# Patient Record
Sex: Female | Born: 1955 | Race: White | Hispanic: No | Marital: Married | Smoking: Current every day smoker
Health system: Southern US, Community
[De-identification: ages and names within clinical notes are randomized; demographics above are authoritative.]

## PROBLEM LIST (undated history)

## (undated) DIAGNOSIS — F32A Depression, unspecified: Secondary | ICD-10-CM

## (undated) DIAGNOSIS — E119 Type 2 diabetes mellitus without complications: Secondary | ICD-10-CM

## (undated) DIAGNOSIS — Z9889 Other specified postprocedural states: Secondary | ICD-10-CM

## (undated) DIAGNOSIS — R000 Tachycardia, unspecified: Secondary | ICD-10-CM

## (undated) DIAGNOSIS — R079 Chest pain, unspecified: Secondary | ICD-10-CM

## (undated) DIAGNOSIS — M1610 Unilateral primary osteoarthritis, unspecified hip: Secondary | ICD-10-CM

## (undated) DIAGNOSIS — T8859XA Other complications of anesthesia, initial encounter: Secondary | ICD-10-CM

## (undated) DIAGNOSIS — R112 Nausea with vomiting, unspecified: Secondary | ICD-10-CM

## (undated) DIAGNOSIS — Q159 Congenital malformation of eye, unspecified: Secondary | ICD-10-CM

## (undated) DIAGNOSIS — Z720 Tobacco use: Secondary | ICD-10-CM

## (undated) DIAGNOSIS — F329 Major depressive disorder, single episode, unspecified: Secondary | ICD-10-CM

## (undated) DIAGNOSIS — I10 Essential (primary) hypertension: Secondary | ICD-10-CM

## (undated) DIAGNOSIS — T4145XA Adverse effect of unspecified anesthetic, initial encounter: Secondary | ICD-10-CM

## (undated) DIAGNOSIS — E785 Hyperlipidemia, unspecified: Secondary | ICD-10-CM

## (undated) DIAGNOSIS — G43909 Migraine, unspecified, not intractable, without status migrainosus: Secondary | ICD-10-CM

## (undated) HISTORY — DX: Unilateral primary osteoarthritis, unspecified hip: M16.10

## (undated) HISTORY — DX: Tobacco use: Z72.0

## (undated) HISTORY — DX: Essential (primary) hypertension: I10

## (undated) HISTORY — DX: Type 2 diabetes mellitus without complications: E11.9

## (undated) HISTORY — DX: Chest pain, unspecified: R07.9

## (undated) HISTORY — DX: Hyperlipidemia, unspecified: E78.5

## (undated) HISTORY — DX: Congenital malformation of eye, unspecified: Q15.9

## (undated) HISTORY — PX: TONSILLECTOMY: SUR1361

## (undated) HISTORY — PX: TUBAL LIGATION: SHX77

---

## 2000-09-25 HISTORY — PX: CARDIAC CATHETERIZATION: SHX172

## 2011-11-01 ENCOUNTER — Encounter: Payer: Self-pay | Admitting: Cardiovascular Disease

## 2011-11-08 ENCOUNTER — Encounter: Payer: Self-pay | Admitting: Cardiovascular Disease

## 2011-11-08 ENCOUNTER — Ambulatory Visit (INDEPENDENT_AMBULATORY_CARE_PROVIDER_SITE_OTHER): Payer: PRIVATE HEALTH INSURANCE | Admitting: Cardiovascular Disease

## 2011-11-08 VITALS — BP 150/80 | HR 113 | Ht 61.0 in | Wt 152.5 lb

## 2011-11-08 DIAGNOSIS — IMO0001 Reserved for inherently not codable concepts without codable children: Secondary | ICD-10-CM

## 2011-11-08 DIAGNOSIS — F17200 Nicotine dependence, unspecified, uncomplicated: Secondary | ICD-10-CM

## 2011-11-08 DIAGNOSIS — E785 Hyperlipidemia, unspecified: Secondary | ICD-10-CM | POA: Insufficient documentation

## 2011-11-08 DIAGNOSIS — E119 Type 2 diabetes mellitus without complications: Secondary | ICD-10-CM

## 2011-11-08 DIAGNOSIS — I10 Essential (primary) hypertension: Secondary | ICD-10-CM

## 2011-11-08 DIAGNOSIS — Z7289 Other problems related to lifestyle: Secondary | ICD-10-CM

## 2011-11-08 DIAGNOSIS — Z789 Other specified health status: Secondary | ICD-10-CM

## 2011-11-08 DIAGNOSIS — R079 Chest pain, unspecified: Secondary | ICD-10-CM

## 2011-11-08 MED ORDER — METOPROLOL TARTRATE 25 MG PO TABS: 25.0000 mg | ORAL_TABLET | Freq: Two times a day (BID) | ORAL | Status: DC

## 2011-11-08 MED ORDER — ATORVASTATIN CALCIUM 20 MG PO TABS: 20.0000 mg | ORAL_TABLET | Freq: Every day | ORAL | Status: DC

## 2011-11-08 NOTE — Assessment & Plan Note (Signed)
She has several risk factors for coronary artery disease including diabetes, hyperlipidemia, smoking. Strong family history of coronary artery disease. We will order a pharmacologic stress test as she is unable to treadmill secondary to back pain.

## 2011-11-08 NOTE — Assessment & Plan Note (Signed)
We'll start Lipitor 10 mg for several weeks titrating up to 20 mg daily. This suggested she stay on her gemfibrozil. Goal LDL less than 100 given she is diabetic.

## 2011-11-08 NOTE — Progress Notes (Signed)
Patient ID: Andrea [name], female    DOB: [birth year], 56 y.o.   MRN: [id number]  HPI Comments: Andrea [name] is a very pleasant 56 year old woman with a history of diabetes, hypertension, hyperlipidemia, long history of smoking for 40 years, strong family history of coronary artery disease with father who had coronary disease and stenting x3 in his early 73s, who presents with recent symptoms of chest pain with exertion.   She reports having symptoms twice per week over the past several months. Typically this occurs with exertion. Symptoms resolved when she stops her activity. She has had shortness of breath with stairs. She described her chest symptoms as a tightness lasting less than 1 minute typically. She denies any diaphoresis, lightheadedness or dizziness.  She does report having a cardiac catheterization in 2000 and was told she had mild coronary artery disease with no severe stenosis. Treadmill stress test around that time with difficulty tolerating the treadmill.  She now has worsening back pain and unable to walk fast or long distances.  Recent hemoglobin A1c 6.7, total cholesterol 279, HDL 170, triglycerides 290  EKG shows sinus tachycardia with rate 113 beats per minute with nonspecific ST abnormality   Outpatient Encounter Prescriptions as of 11/08/2011  Medication Sig Dispense Refill  . aspirin 81 MG tablet Take 81 mg by mouth daily.      Marland Kitchen gabapentin (NEURONTIN) 100 MG capsule Take 100 mg by mouth 2 (two) times daily.      Marland Kitchen gemfibrozil (LOPID) 600 MG tablet Take 600 mg by mouth 2 (two) times daily before a meal.      . lisinopril-hydrochlorothiazide (PRINZIDE,ZESTORETIC) 10-12.5 MG per tablet Take 1 tablet by mouth daily.      . metFORMIN (GLUCOPHAGE-XR) 500 MG 24 hr tablet Take 500 mg by mouth 2 (two) times daily.      . MULTIPLE VITAMIN tablet Take 1 tablet by mouth daily.      . Nutritional Supplements (ESTROVEN ENERGY PO) Take by mouth daily.      . Omega-3 Fatty Acids (FISH  OIL BURP-LESS) 1000 MG CAPS Take 1,000 mg by mouth daily.      . potassium gluconate 595 MG TABS Take 595 mg by mouth daily.       Review of Systems  Constitutional: Negative.   HENT: Negative.   Eyes: Negative.   Respiratory: Positive for shortness of breath.   Cardiovascular: Positive for chest pain and palpitations.  Gastrointestinal: Negative.   Musculoskeletal: Negative.   Skin: Negative.   Neurological: Negative.   Hematological: Negative.   Psychiatric/Behavioral: Negative.   All other systems reviewed and are negative.     BP 150/80  Pulse 113  Ht 5\' 1"  (1.549 m)  Wt 152 lb 8 oz (69.174 kg)  BMI 28.81 kg/m2  Physical Exam  Nursing note and vitals reviewed. Constitutional: She is oriented to person, place, and time. She appears well-developed and well-nourished.  HENT:  Head: Normocephalic.  Nose: Nose normal.  Mouth/Throat: Oropharynx is clear and moist.  Eyes: Conjunctivae are normal. Pupils are equal, round, and reactive to light.  Neck: Normal range of motion. Neck supple. No JVD present.  Cardiovascular: Normal rate, regular rhythm, S1 normal, S2 normal, normal heart sounds and intact distal pulses.  Exam reveals no gallop and no friction rub.   No murmur heard. Pulmonary/Chest: Effort normal and breath sounds normal. No respiratory distress. She has no wheezes. She has no rales. She exhibits no tenderness.  Abdominal: Soft. Bowel sounds are normal. She  exhibits no distension. There is no tenderness.  Musculoskeletal: Normal range of motion. She exhibits no edema and no tenderness.  Lymphadenopathy:    She has no cervical adenopathy.  Neurological: She is alert and oriented to person, place, and time. Coordination normal.  Skin: Skin is warm and dry. No rash noted. No erythema.  Psychiatric: She has a normal mood and affect. Her behavior is normal. Judgment and thought content normal.         Assessment and Plan       He is

## 2011-11-08 NOTE — Assessment & Plan Note (Signed)
We have encouraged continued exercise, careful diet management in an effort to lose weight. 

## 2011-11-08 NOTE — Patient Instructions (Addendum)
You are doing well. Please start metoprolol one pill twice a day for heart rate and blood pressure  Please start low dose lipitor one in the evening  We will schedule a stress test No caffeine 24 hours before the test No metoprolol the night before or morning of the test No food the morning of the test   Please call us if you have new issues that need to be addressed before your next appt.  Your physician wants you to follow-up in: 6 months.  You will receive a reminder letter in the mail two months in advance. If you don't receive a letter, please call our office to schedule the follow-up appointment.

## 2011-11-08 NOTE — Assessment & Plan Note (Signed)
We have strongly encouraged her to quit smoking. She has been smoking for 40 years.

## 2011-11-08 NOTE — Assessment & Plan Note (Signed)
Blood pressure is elevated. Heart rate is also elevated today. We'll start metoprolol tartrate 25 mg twice a day.

## 2011-11-17 ENCOUNTER — Ambulatory Visit: Payer: Self-pay | Admitting: Cardiovascular Disease

## 2011-11-17 DIAGNOSIS — R079 Chest pain, unspecified: Secondary | ICD-10-CM

## 2011-11-20 ENCOUNTER — Other Ambulatory Visit: Payer: Self-pay | Admitting: Cardiovascular Disease

## 2011-11-20 DIAGNOSIS — R079 Chest pain, unspecified: Secondary | ICD-10-CM

## 2011-11-20 DIAGNOSIS — Z789 Other specified health status: Secondary | ICD-10-CM

## 2011-11-24 ENCOUNTER — Telehealth: Payer: Self-pay | Admitting: Cardiovascular Disease

## 2011-11-24 NOTE — Telephone Encounter (Signed)
Pt calling for results from stress test. Told pt that it had been sent to [facility] for review

## 2011-11-24 NOTE — Telephone Encounter (Signed)
Can you review her test so I can call with results, please? Thanks!

## 2011-11-24 NOTE — Telephone Encounter (Signed)
If she needs details, it is in computer Looks normal, no ischemia thx

## 2011-11-24 NOTE — Telephone Encounter (Signed)
Pt informed Understanding verb 

## 2012-02-07 ENCOUNTER — Other Ambulatory Visit: Payer: Self-pay | Admitting: Cardiovascular Disease

## 2012-02-07 MED ORDER — ATORVASTATIN CALCIUM 20 MG PO TABS: 20.0000 mg | ORAL_TABLET | Freq: Every day | ORAL | Status: DC

## 2012-02-07 MED ORDER — METOPROLOL TARTRATE 25 MG PO TABS: 25.0000 mg | ORAL_TABLET | Freq: Two times a day (BID) | ORAL | Status: DC

## 2012-02-07 NOTE — Telephone Encounter (Signed)
Sent 90 day Refill for Metoprolol and Atorvastatin.

## 2012-02-07 NOTE — Telephone Encounter (Signed)
90 DAY SUPPLY SENT IN WITH REFILLS

## 2012-02-09 ENCOUNTER — Other Ambulatory Visit: Payer: Self-pay

## 2012-02-09 MED ORDER — ATORVASTATIN CALCIUM 20 MG PO TABS: 20.0000 mg | ORAL_TABLET | Freq: Every day | ORAL | Status: DC

## 2012-02-09 NOTE — Telephone Encounter (Signed)
Refill sent for atorvastatin. 

## 2012-02-12 ENCOUNTER — Other Ambulatory Visit: Payer: Self-pay

## 2012-02-12 MED ORDER — ATORVASTATIN CALCIUM 20 MG PO TABS: 20.0000 mg | ORAL_TABLET | Freq: Every day | ORAL | Status: DC

## 2012-02-12 NOTE — Telephone Encounter (Signed)
Refill sent for atorvastatin. 

## 2012-03-04 ENCOUNTER — Other Ambulatory Visit: Payer: Self-pay | Admitting: Cardiovascular Disease

## 2012-03-04 MED ORDER — ATORVASTATIN CALCIUM 20 MG PO TABS: 20.0000 mg | ORAL_TABLET | Freq: Every day | ORAL | Status: DC

## 2012-03-04 NOTE — Telephone Encounter (Signed)
Refilled Atorvastatin.

## 2012-04-24 ENCOUNTER — Ambulatory Visit: Payer: Self-pay | Admitting: Family Medicine

## 2012-05-06 ENCOUNTER — Encounter: Payer: Self-pay | Admitting: Cardiovascular Disease

## 2012-05-06 ENCOUNTER — Ambulatory Visit (INDEPENDENT_AMBULATORY_CARE_PROVIDER_SITE_OTHER): Payer: PRIVATE HEALTH INSURANCE | Admitting: Cardiovascular Disease

## 2012-05-06 VITALS — BP 122/80 | HR 89 | Ht 60.0 in | Wt 159.5 lb

## 2012-05-06 DIAGNOSIS — E119 Type 2 diabetes mellitus without complications: Secondary | ICD-10-CM

## 2012-05-06 DIAGNOSIS — F17200 Nicotine dependence, unspecified, uncomplicated: Secondary | ICD-10-CM

## 2012-05-06 DIAGNOSIS — I10 Essential (primary) hypertension: Secondary | ICD-10-CM

## 2012-05-06 DIAGNOSIS — E785 Hyperlipidemia, unspecified: Secondary | ICD-10-CM

## 2012-05-06 MED ORDER — ATORVASTATIN CALCIUM 40 MG PO TABS: 40.0000 mg | ORAL_TABLET | Freq: Every day | ORAL | Status: DC

## 2012-05-06 NOTE — Assessment & Plan Note (Signed)
We have encouraged continued exercise, careful diet management in an effort to lose weight. 

## 2012-05-06 NOTE — Patient Instructions (Addendum)
You are doing well. No medication changes were made.  Please call us if you have new issues that need to be addressed before your next appt.  Your physician wants you to follow-up in: 6 months.  You will receive a reminder letter in the mail two months in advance. If you don't receive a letter, please call our office to schedule the follow-up appointment.   

## 2012-05-06 NOTE — Assessment & Plan Note (Signed)
We have encouraged her to restart Lipitor 20 mg daily. If no side effects within several weeks, increase Lipitor to 40 mg daily. LFTs are relatively benign with ALT 49 (likely from fatty liver). We have recommended weight loss.

## 2012-05-06 NOTE — Progress Notes (Signed)
Patient ID: Genola Swaziland, female    DOB: Dec 18, 1955, 56 y.o.   MRN: [id number]  HPI Comments: Ms. Swaziland is a very pleasant 56 year old woman with a history of diabetes, hypertension, hyperlipidemia, long history of smoking for 40 years, who continues to smoke,  strong family history of coronary artery disease with father who had coronary disease and stenting x3 in his early 68s, who presents for routine followup. Prior episodes of chest pain with negative stress test.  She denies any recent episodes of chest pain. She does report having stomach problems and stopped her statin, gemfibrozil and Neurontin. She continues to smoke 7-15 cigarettes per day. Hemoglobin A1c 7.3. She does report having back problems. She attributes her abdominal pain to gemfibrozil.  She does report having a cardiac catheterization in 2000 and was told she had mild coronary artery disease with no severe stenosis. Treadmill stress test around that time with difficulty tolerating the treadmill.   Lab work shows total cholesterol 262, LDL 159, triglycerides 284, hemoglobin A1c 7.3 Abdominal ultrasound showing hepatic steatohepatitis  EKG shows normal sinus rhythm with rate 89 beats per minute, no significant ST or T wave changes   Outpatient Encounter Prescriptions as of 05/06/2012  Medication Sig Dispense Refill  . aspirin 81 MG tablet Take 81 mg by mouth daily.      Marland Kitchen lisinopril-hydrochlorothiazide (PRINZIDE,ZESTORETIC) 10-12.5 MG per tablet Take 1 tablet by mouth daily.      . metFORMIN (GLUCOPHAGE-XR) 500 MG 24 hr tablet Take 500 mg by mouth 2 (two) times daily.      . metoprolol tartrate (LOPRESSOR) 25 MG tablet Take 1 tablet (25 mg total) by mouth 2 (two) times daily.  180 tablet  3  . MULTIPLE VITAMIN tablet Take 1 tablet by mouth daily.      . Nutritional Supplements (ESTROVEN ENERGY PO) Take by mouth daily.      . Omega-3 Fatty Acids (FISH OIL BURP-LESS) 1000 MG CAPS Take 1,000 mg by mouth daily.      .  potassium gluconate 595 MG TABS Take 595 mg by mouth daily.      Marland Kitchen atorvastatin (LIPITOR) 40 MG tablet Take 1 tablet (40 mg total) by mouth daily.  30 tablet  11    Review of Systems  Constitutional: Negative.   HENT: Negative.   Eyes: Negative.   Gastrointestinal: Positive for abdominal pain.  Musculoskeletal: Negative.   Skin: Negative.   Neurological: Negative.   Hematological: Negative.   Psychiatric/Behavioral: Negative.   All other systems reviewed and are negative.     BP 122/80  Pulse 89  Ht 5' (1.524 m)  Wt 159 lb 8 oz (72.349 kg)  BMI 31.15 kg/m2  Physical Exam  Nursing note and vitals reviewed. Constitutional: She is oriented to person, place, and time. She appears well-developed and well-nourished.  HENT:  Head: Normocephalic.  Nose: Nose normal.  Mouth/Throat: Oropharynx is clear and moist.  Eyes: Conjunctivae normal are normal. Pupils are equal, round, and reactive to light.  Neck: Normal range of motion. Neck supple. No JVD present.  Cardiovascular: Normal rate, regular rhythm, S1 normal, S2 normal, normal heart sounds and intact distal pulses.  Exam reveals no gallop and no friction rub.   No murmur heard. Pulmonary/Chest: Effort normal and breath sounds normal. No respiratory distress. She has no wheezes. She has no rales. She exhibits no tenderness.  Abdominal: Soft. Bowel sounds are normal. She exhibits no distension. There is no tenderness.  Musculoskeletal: Normal range of motion.  She exhibits no edema and no tenderness.  Lymphadenopathy:    She has no cervical adenopathy.  Neurological: She is alert and oriented to person, place, and time. Coordination normal.  Skin: Skin is warm and dry. No rash noted. No erythema.  Psychiatric: She has a normal mood and affect. Her behavior is normal. Judgment and thought content normal.         Assessment and Plan       He is

## 2012-05-06 NOTE — Assessment & Plan Note (Signed)
We have encouraged her to continue to work on weaning her cigarettes and smoking cessation. She will continue to work on this and does not want any assistance with chantix.  

## 2012-05-06 NOTE — Assessment & Plan Note (Signed)
Blood pressure is well controlled on today's visit. No changes made to the medications. 

## 2012-08-29 ENCOUNTER — Other Ambulatory Visit: Payer: Self-pay | Admitting: Cardiovascular Disease

## 2013-01-01 ENCOUNTER — Ambulatory Visit (INDEPENDENT_AMBULATORY_CARE_PROVIDER_SITE_OTHER): Payer: BC Managed Care – PPO | Admitting: Cardiovascular Disease

## 2013-01-01 ENCOUNTER — Encounter: Payer: Self-pay | Admitting: Cardiovascular Disease

## 2013-01-01 VITALS — BP 128/82 | HR 104 | Ht 61.0 in | Wt 161.5 lb

## 2013-01-01 DIAGNOSIS — I498 Other specified cardiac arrhythmias: Secondary | ICD-10-CM

## 2013-01-01 DIAGNOSIS — E119 Type 2 diabetes mellitus without complications: Secondary | ICD-10-CM

## 2013-01-01 DIAGNOSIS — E785 Hyperlipidemia, unspecified: Secondary | ICD-10-CM

## 2013-01-01 DIAGNOSIS — F17200 Nicotine dependence, unspecified, uncomplicated: Secondary | ICD-10-CM

## 2013-01-01 DIAGNOSIS — I10 Essential (primary) hypertension: Secondary | ICD-10-CM

## 2013-01-01 DIAGNOSIS — R000 Tachycardia, unspecified: Secondary | ICD-10-CM | POA: Insufficient documentation

## 2013-01-01 MED ORDER — METOPROLOL TARTRATE 50 MG PO TABS: 50.0000 mg | ORAL_TABLET | Freq: Two times a day (BID) | ORAL | Status: DC

## 2013-01-01 MED ORDER — SIMVASTATIN 40 MG PO TABS: 40.0000 mg | ORAL_TABLET | Freq: Every day | ORAL | Status: DC

## 2013-01-01 NOTE — Assessment & Plan Note (Signed)
Uncertain why she stopped her generic Lipitor. We have suggested she try simvastatin 20 mg daily for several weeks before titrating up to 40 mg daily.

## 2013-01-01 NOTE — Assessment & Plan Note (Signed)
We have encouraged her to continue to work on weaning her cigarettes and smoking cessation. She will continue to work on this and does not want any assistance with chantix.  

## 2013-01-01 NOTE — Patient Instructions (Addendum)
Your heart rate is elevated Please increase the  Metoprolol to 50 mg twice a day  Please cut the lisinopril HCTZ in 1/2  Please start simvastatin 1/2 pill once a day After a few weeks, increase the dose to a full pill  Please call us if you have new issues that need to be addressed before your next appt.  Your physician wants you to follow-up in: 6 months.  You will receive a reminder letter in the mail two months in advance. If you don't receive a letter, please call our office to schedule the follow-up appointment.

## 2013-01-01 NOTE — Assessment & Plan Note (Signed)
Heart rate continued to be greater than 100 throughout the entire visit today. We will increase her metoprolol up to 50 mm twice a day and decrease her lisinopril HCTZ in half in an effort to slow her heart rate. If heart rate continues to run fast, could increase metoprolol to 100 mg twice a day and hold the lisinopril HCT.

## 2013-01-01 NOTE — Assessment & Plan Note (Signed)
We have encouraged continued exercise, careful diet management in an effort to lose weight. 

## 2013-01-01 NOTE — Progress Notes (Signed)
Patient ID: Andrea [name], female    DOB: 12-28-1955, 57 y.o.   MRN: [id number]  HPI Comments: Andrea [name] is a very pleasant 57 year old woman with a history of diabetes, hypertension, hyperlipidemia, long history of smoking for 40 years, who continues to smoke,  strong family history of coronary artery disease with father who had coronary disease and stenting x3 in his early 64s, who presents for routine followup. Prior episodes of chest pain with negative stress test.  She denies any recent episodes of chest pain.  She does have a history of stomach problems. In the past she has stopped her statin,  gemfibrozil and Neurontin for this.  Today she is uncertain why she stopped her generic Lipitor . Uncertain if it was the cost or side effects . She continues to smoke 7-15 cigarettes per day.   She does report having back problems.  She does report having a cardiac catheterization in 2000 and was told she had mild coronary artery disease with no severe stenosis. Treadmill stress test around that time with difficulty tolerating the treadmill.   Lab work shows total cholesterol 262, LDL 159, triglycerides 284, hemoglobin A1c 7.3 Abdominal ultrasound showing hepatic steatohepatitis  EKG shows normal sinus rhythm with rate 104 beats per minute, no significant ST or T wave changes Most recent total cholesterol in February 2014 was 329, in December 2013 total cholesterol was 242   Outpatient Encounter Prescriptions as of 01/01/2013  Medication Sig Dispense Refill  . aspirin 81 MG tablet Take 81 mg by mouth daily.      . ciprofloxacin (CIPRO) 250 MG tablet Take 250 mg by mouth 2 (two) times daily.       Marland Kitchen lisinopril-hydrochlorothiazide (PRINZIDE,ZESTORETIC) 10-12.5 MG per tablet Take 1/2 tablet daily      . metFORMIN (GLUCOPHAGE-XR) 500 MG 24 hr tablet Take 500 mg by mouth 2 (two) times daily.      . MULTIPLE VITAMIN tablet Take 1 tablet by mouth daily.      . Nutritional Supplements (ESTROVEN ENERGY  PO) Take by mouth daily.      . Omega-3 Fatty Acids (FISH OIL BURP-LESS) 1000 MG CAPS Take 1,000 mg by mouth daily.      . potassium gluconate 595 MG TABS Take 595 mg by mouth daily.      .  metoprolol tartrate (LOPRESSOR) 25 MG tablet Take 1 tablet (25 mg total) by mouth 2 (two) times daily.  180 tablet  3  . simvastatin (ZOCOR) 40 MG tablet Take 1 tablet (40 mg total) by mouth at bedtime.  30 tablet  6    Review of Systems  Constitutional: Negative.   HENT: Negative.   Eyes: Negative.   Respiratory: Negative.   Cardiovascular: Negative.   Gastrointestinal: Positive for abdominal pain.  Musculoskeletal: Negative.   Skin: Negative.   Neurological: Negative.   Psychiatric/Behavioral: Negative.   All other systems reviewed and are negative.     BP 128/82  Pulse 104  Ht 5\' 1"  (1.549 m)  Wt 161 lb 8 oz (73.256 kg)  BMI 30.53 kg/m2  Physical Exam  Nursing note and vitals reviewed. Constitutional: She is oriented to person, place, and time. She appears well-developed and well-nourished.  HENT:  Head: Normocephalic.  Nose: Nose normal.  Mouth/Throat: Oropharynx is clear and moist.  Eyes: Conjunctivae are normal. Pupils are equal, round, and reactive to light.  Neck: Normal range of motion. Neck supple. No JVD present.  Cardiovascular: Regular rhythm, S1 normal, S2 normal, normal  heart sounds and intact distal pulses.  Tachycardia present.  Exam reveals no gallop and no friction rub.   No murmur heard. Pulmonary/Chest: Effort normal and breath sounds normal. No respiratory distress. She has no wheezes. She has no rales. She exhibits no tenderness.  Abdominal: Soft. Bowel sounds are normal. She exhibits no distension. There is no tenderness.  Musculoskeletal: Normal range of motion. She exhibits no edema and no tenderness.  Lymphadenopathy:    She has no cervical adenopathy.  Neurological: She is alert and oriented to person, place, and time. Coordination normal.  Skin: Skin is  warm and dry. No rash noted. No erythema.  Psychiatric: She has a normal mood and affect. Her behavior is normal. Judgment and thought content normal.    Assessment and Plan       He is

## 2013-01-01 NOTE — Assessment & Plan Note (Signed)
Blood pressure changes as above. Sinus tachycardia today.

## 2013-04-30 ENCOUNTER — Emergency Department: Payer: Self-pay | Admitting: Urology

## 2013-08-15 ENCOUNTER — Other Ambulatory Visit: Payer: Self-pay | Admitting: Cardiovascular Disease

## 2013-09-08 ENCOUNTER — Ambulatory Visit: Payer: BC Managed Care – PPO | Admitting: Cardiovascular Disease

## 2013-09-12 ENCOUNTER — Ambulatory Visit (INDEPENDENT_AMBULATORY_CARE_PROVIDER_SITE_OTHER): Payer: BC Managed Care – PPO | Admitting: Nurse Practitioner

## 2013-09-12 ENCOUNTER — Encounter: Payer: Self-pay | Admitting: Cardiovascular Disease

## 2013-09-12 VITALS — BP 149/85 | HR 85 | Ht 61.0 in | Wt 167.0 lb

## 2013-09-12 DIAGNOSIS — F17200 Nicotine dependence, unspecified, uncomplicated: Secondary | ICD-10-CM

## 2013-09-12 DIAGNOSIS — Z720 Tobacco use: Secondary | ICD-10-CM

## 2013-09-12 DIAGNOSIS — E785 Hyperlipidemia, unspecified: Secondary | ICD-10-CM

## 2013-09-12 DIAGNOSIS — R0789 Other chest pain: Secondary | ICD-10-CM

## 2013-09-12 DIAGNOSIS — R072 Precordial pain: Secondary | ICD-10-CM

## 2013-09-12 DIAGNOSIS — I10 Essential (primary) hypertension: Secondary | ICD-10-CM

## 2013-09-12 MED ORDER — EZETIMIBE 10 MG PO TABS: 10.0000 mg | ORAL_TABLET | Freq: Every day | ORAL | Status: DC

## 2013-09-12 NOTE — Patient Instructions (Addendum)
Please come in for labs in 6 weeks. Please increase your lisinopril/HCTZ to a whole pill daily. Please start Zetia 10mg  daily.  [facility] MYOVIEW  Your caregiver has ordered a Stress Test with nuclear imaging. The purpose of this test is to evaluate the blood supply to your heart muscle. This procedure is referred to as a "Non-Invasive Stress Test." This is because other than having an IV started in your vein, nothing is inserted or "invades" your body. Cardiac stress tests are done to find areas of poor blood flow to the heart by determining the extent of coronary artery disease (CAD). Some patients exercise on a treadmill, which naturally increases the blood flow to your heart, while others who are  unable to walk on a treadmill due to physical limitations have a pharmacologic/chemical stress agent called Lexiscan . This medicine will mimic walking on a treadmill by temporarily increasing your coronary blood flow.   Please note: these test may take anywhere between 2-4 hours to complete  [facility]  THE VOLUNTEERS AT THE FIRST DESK WILL DIRECT YOU WHERE TO GO  Date of Procedure:_______Friday, May 15____________________  Arrival Time for Procedure:_______7:15am____________________  Instructions regarding medication:    _X__:  Hold betablocker(s) night before procedure and morning of procedure: METOPROLOL   PLEASE NOTIFY THE OFFICE AT LEAST 24 HOURS IN ADVANCE IF YOU ARE UNABLE TO KEEP YOUR APPOINTMENT.  [phone] AND  PLEASE NOT[facility] AT LEAST 24 HOURS IN ADVANCE IF YOU ARE UNABLE TO KEEP YOUR APPOINTMENT. [phone]  How to prepare for your Myoview test:  1. Do not eat or drink after midnight 2. No caffeine for 24 hours prior to test 3. No smoking 24 hours prior to test. 4. Your medication may be taken with water.  If your doctor stopped a medication because of this test, do not take that medication. 5. Ladies, please do not  wear dresses.  Skirts or pants are appropriate. Please wear a short sleeve shirt. 6. No perfume, cologne or lotion. 7. Wear comfortable walking shoes. No heels!  Please call us if you have new issues that need to be addressed before your next appt.  Your physician wants you to follow-up in: 7 weeks with Dr. Billey CoGollan  You will receive a reminder letter in the mail two months in advance. If you don't receive a letter, please call our office to schedule the follow-up appointment.

## 2013-09-12 NOTE — Progress Notes (Signed)
Patient Name: Andrea [name] Date of Encounter: 09/12/2013  Primary Care Provider:  Wonda ChengMOFFETT, JOEL, MD Primary Cardiologist:  Concha Se. Gollan, MD   Patient Profile  58 y/o female with a FH of CAD and multiple risk factors who presents for routine clinic f/u.  Problem List   Past Medical History  Diagnosis Date  . Type II or unspecified type diabetes mellitus without mention of complication, not stated as uncontrolled   . HTN (hypertension)   . Hyperlipidemia   . Unspecified congenital anomaly of eye   . Tobacco abuse   . Arthritis of hip   . Chest pain     a. 11/2011 Lexi MV: No ischemia/infarct, EF 46%.   Past Surgical History  Procedure Laterality Date  . Cesarean section      x 2  . Tubal ligation    . Cardiac catheterization  Sep 25, 2000    Dr[facility]alski [facility] which was negative.    Allergies  Allergies  Allergen Reactions  . Enalapril     Muscle and joint pain    HPI  58 y/o female with a h/o HTN, HL, DM, tobacco abuse, and a FH of CAD.  She underwent stress testing in 11/2011, which was low-risk.  She was last seen in clinic last August.  She says that over the past 6 mos, she has had a lot of stress in her life.  She is often having to watch one of her 3 grandchildren (ages 423, 803, and 7315 mos).  This disrupts her daily schedule and as a result causes her a fair amt of stress.  In the setting of higher levels of anxiety, she will sometimes experience mild substernal chest tightness w/o associated Ss.  She will have to stop doing what she is doing, sit and relax prior to Ss abating.  Ss do not typically last longer than 10-15 mins and resolve spontaneously.  She doesn't think that she's had these Ss prior to the past 6 mos.  She doesn't generally experience exertional chest pain but notes that she hasn't really been exerting herself either.  She continues to smoke < 1/2 ppd.  Her life stress prevents her from cutting back further but she knows that she needs to quit.   She isn't sure how her BP is doing @ home as she never checks it.  She stopped taking simvastatin b/c of thigh and buttock aches/myalgias.  These Ss have since resolved. She denies palpitations, dyspnea, pnd, orthopnea, n, v, dizziness, syncope, edema, weight gain, or early satiety.   Home Medications  Prior to Admission medications   Medication Sig Start Date End Date Taking? Authorizing Provider  aspirin 81 MG tablet Take 81 mg by mouth daily.   Yes Historical Provider, MD  ciprofloxacin (CIPRO) 250 MG tablet Take 250 mg by mouth 2 (two) times daily.  12/26/12  Yes Historical Provider, MD  lisinopril-hydrochlorothiazide (PRINZIDE,ZESTORETIC) 10-12.5 MG per tablet Take 1/2 tablet daily   Yes Historical Provider, MD  metFORMIN (GLUCOPHAGE-XR) 500 MG 24 hr tablet Take 500 mg by mouth 2 (two) times daily.   Yes Historical Provider, MD  metoprolol (LOPRESSOR) 50 MG tablet TAKE ONE TABLET BY MOUTH TWICE DAILY 08/15/13  Yes Antonieta Ibaimothy J Gollan, MD  MULTIPLE VITAMIN tablet Take 1 tablet by mouth daily.   Yes Historical Provider, MD  Nutritional Supplements (ESTROVEN ENERGY PO) Take by mouth daily.   Yes Historical Provider, MD  Omega-3 Fatty Acids (FISH OIL BURP-LESS) 1000 MG CAPS Take  1,000 mg by mouth daily.   Yes Historical Provider, MD  potassium gluconate 595 MG TABS Take 595 mg by mouth daily.   Yes Historical Provider, MD    Review of Systems  As above, she has been experiencing a fair amt of stress @ home.  In that setting, she has been having chest pain/tightness.  All other systems reviewed and are otherwise negative except as noted above.  Physical Exam  Blood pressure 149/85, pulse 85, height 5\' 1"  (1.549 m), weight 167 lb (75.751 kg).  General: Pleasant, NAD Psych: Normal affect. Neuro: Alert and oriented X 3. Moves all extremities spontaneously. HEENT: Normal  Neck: Supple without bruits or JVD. Lungs:  Resp regular and unlabored, CTA. Heart: RRR no s3, s4, or murmurs. Abdomen:  Soft, non-tender, non-distended, BS + x 4.  Extremities: No clubbing, cyanosis or edema. DP/PT/Radials 2+ and equal bilaterally.  Accessory Clinical Findings  ECG - RSR, 85, no acute st/t changes.  Assessment & Plan  1.  Midsternal chest tightness:  Pt has been experiencing periodic midsternal chest tightness in the setting of increased life stress and anxiety while watching her grandchildren.  She doesn't really report exertional Ss.  This is a new Ss for her.  Her last myoview was performed in 11/2011 and was low-risk.  She has ongoing risk factors including htn, tob abuse, hl, and overweight.  We will arrange repeat lexiscan myoview as it has been nearly two years.  She says that she cannot walk on a treadmill 2/2 chronic bilat hip pain and arthritis.  Cont asa, bb, acei.  Will titrate acei/hctz combo for better bp control.  She stopped taking zocor b/c she developed discomfort in her thighs/buttocks, which resolved after stopping simva.  She says that she has had similar Ss with multiple statins now.  She has never tried zetia and is willing to try.  2.  HTN:  BP elevated today.  155/75 on repeat assessment.  I've asked her to start taking a whole tab of lisinopril-hctz 10-12.5mg  daily (prev taking 1/2 tab).  Will f/u bmet in a few wks. Cont BB.  3.  HL:  As above, has not tolerated multiple statins.  Will try zetia 10mg  daily.  F/U lipids/lft's in 6 wks.  4.  DM:  Per IM.  5.  Tob Abuse:  Cessation advised.  She is smoking < 1/2 ppd and says that she knows that she needs to quit.  Her husband also smokes and she thinks that it will be difficult to cut back any further if he is not also willing to quit.  6.  Dispo:  F/u myoview as above.  She was due to have annual labs drawn at the end of last year but never did.  We've entered orders for cbc, bmet, lft's, tsh, a1c, and fasting lipids, all to be drawn in 6 wks.  Will arrange for f/u with Dr. Mariah MillingGollan shortly after that.  Ok [name],  NP 09/12/2013, 3:39 PM

## 2013-09-15 ENCOUNTER — Telehealth: Payer: Self-pay | Admitting: *Deleted

## 2013-09-15 NOTE — Telephone Encounter (Signed)
Patient called and Zetia is much too expensive. Needs something else. Please call.

## 2013-09-16 NOTE — Telephone Encounter (Signed)
Spoke w/ pt.  Advised her of Dr. Windell HummingbirdGollan's recommendation. She reports that since she stopped her simvastatin, she is "amazed" at how good her legs feel.  She is considering rescheduling her lexiscan for a later date when she feels that she can walk on the treadmill. Reports that she has been walking more frequently and is feeling much better. Advised pt that I would inform Dr. Mariah MillingGollan and call her back w/ his recommendation on whether this test should be postponed. Please advise.  Thank you.

## 2013-09-16 NOTE — Telephone Encounter (Signed)
No other prescription options if she can not take statins, gemfibrozil, zetia Needs to stop smoking Could try over the counter red yeast rice, weight loss

## 2013-09-17 NOTE — Telephone Encounter (Signed)
It is up to her when or if she would like to do the stress test. Depends on her symptoms  We can discuss statin options on her next visit Sounds like she has tried a few

## 2013-09-18 NOTE — Telephone Encounter (Signed)
Spoke w/ pt.  She would like to cancel her lexi and call back to reschedule exercise myoview when she is ready. She wanted to clarify that she is not taking zetia due to intolerance, but due to the price.  Advised her that Dr. Mariah MillingGollan will discuss options at her next ov, but in the meantime, advised her to increase her exercise, watch her diet, and try to stop smoking.  She is agreeable to this and will call w/ further questions or concerns.

## 2013-10-10 ENCOUNTER — Other Ambulatory Visit: Payer: Self-pay | Admitting: *Deleted

## 2013-10-10 MED ORDER — METOPROLOL TARTRATE 50 MG PO TABS: ORAL_TABLET | ORAL | Status: DC

## 2013-10-24 ENCOUNTER — Ambulatory Visit (INDEPENDENT_AMBULATORY_CARE_PROVIDER_SITE_OTHER): Payer: BC Managed Care – PPO

## 2013-10-24 DIAGNOSIS — R072 Precordial pain: Secondary | ICD-10-CM

## 2013-10-24 DIAGNOSIS — I10 Essential (primary) hypertension: Secondary | ICD-10-CM

## 2013-10-24 DIAGNOSIS — R0789 Other chest pain: Secondary | ICD-10-CM

## 2013-10-25 LAB — BASIC METABOLIC PANEL
BUN / CREAT RATIO: 20 (ref 9–23)
BUN: 13 mg/dL (ref 6–24)
CHLORIDE: 97 mmol/L (ref 97–108)
CO2: 24 mmol/L (ref 18–29)
Calcium: 9.2 mg/dL (ref 8.7–10.2)
Creatinine, Ser: 0.65 mg/dL (ref 0.57–1.00)
GFR calc Af Amer: 116 mL/min/{1.73_m2} (ref >59)
GFR calc non Af Amer: 100 mL/min/{1.73_m2} (ref >59)
Glucose: 191 mg/dL — ABNORMAL HIGH (ref 65–99)
POTASSIUM: 4.4 mmol/L (ref 3.5–5.2)
Sodium: 136 mmol/L (ref 134–144)

## 2013-10-25 LAB — LIPID PANEL
Chol/HDL Ratio: 10.3 ratio units — ABNORMAL HIGH (ref 0.0–4.4)
Cholesterol, Total: 341 mg/dL — ABNORMAL HIGH (ref 100–199)
HDL: 33 mg/dL — ABNORMAL LOW (ref >39)
TRIGLYCERIDES: 949 mg/dL — AB (ref 0–149)

## 2013-10-25 LAB — CBC WITH DIFFERENTIAL
BASOS ABS: 0 10*3/uL (ref 0.0–0.2)
Basos: 1 %
EOS ABS: 0.1 10*3/uL (ref 0.0–0.4)
Eos: 2 %
HEMATOCRIT: 37.3 % (ref 34.0–46.6)
Hemoglobin: 12.8 g/dL (ref 11.1–15.9)
Immature Grans (Abs): 0 10*3/uL (ref 0.0–0.1)
Immature Granulocytes: 0 %
Lymphocytes Absolute: 2.5 10*3/uL (ref 0.7–3.1)
Lymphs: 42 %
MCH: 29.7 pg (ref 26.6–33.0)
MCHC: 34.3 g/dL (ref 31.5–35.7)
MCV: 87 fL (ref 79–97)
MONOS ABS: 0.5 10*3/uL (ref 0.1–0.9)
Monocytes: 8 %
NEUTROS ABS: 2.8 10*3/uL (ref 1.4–7.0)
Neutrophils Relative %: 47 %
Platelets: 202 10*3/uL (ref 150–379)
RBC: 4.31 x10E6/uL (ref 3.77–5.28)
RDW: 14.6 % (ref 12.3–15.4)
WBC: 6 10*3/uL (ref 3.4–10.8)

## 2013-10-25 LAB — HEMOGLOBIN A1C
ESTIMATED AVERAGE GLUCOSE: 214 mg/dL
Hgb A1c MFr Bld: 9.1 % — ABNORMAL HIGH (ref 4.8–5.6)

## 2013-10-25 LAB — HEPATIC FUNCTION PANEL
ALK PHOS: 53 IU/L (ref 39–117)
ALT: 43 IU/L — AB (ref 0–32)
AST: 28 IU/L (ref 0–40)
Albumin: 4.3 g/dL (ref 3.5–5.5)
BILIRUBIN DIRECT: 0.13 mg/dL (ref 0.00–0.40)
Total Bilirubin: 0.4 mg/dL (ref 0.0–1.2)
Total Protein: 6.7 g/dL (ref 6.0–8.5)

## 2013-10-25 LAB — TSH: TSH: 3.96 u[IU]/mL (ref 0.450–4.500)

## 2013-11-03 ENCOUNTER — Ambulatory Visit: Payer: BC Managed Care – PPO | Admitting: Cardiovascular Disease

## 2014-02-02 ENCOUNTER — Telehealth: Payer: Self-pay

## 2014-02-02 NOTE — Telephone Encounter (Signed)
Pt has a question regarding her metoprolol.

## 2014-02-02 NOTE — Telephone Encounter (Signed)
Pt's PCP has stopped all of her meds in an effort to find out what is affecting her liver.  He would like for Dr. Mariah Milling to stop pt's metoprolol.  Pt does not have monitor at home, does not check BP or HR, but states that they are normal at her doctor visits.  Advised pt that she will need to obtain a monitor, as she will need to monitor these closely if meds are held.  Please advise.  Thank you.

## 2014-02-02 NOTE — Telephone Encounter (Signed)
Spoke w/ pt.  She reports that she saw her PCP, Dr. Marguerite Olea, last month ago.  Reports that she is having "issues" w/ her liver enzymes.

## 2014-02-03 NOTE — Telephone Encounter (Signed)
Can we try to obtain any lab work from Dr. Marguerite Olea Her liver numbers recently were normal Diabetes was poorly controlled, cholesterol poorly controlled  Metoprolol often useful blood pressure If she would like to stop her metoprolol, we would need to see blood pressure measurements Sounds like she needs an endocrinologist to help with diabetes management

## 2014-02-04 NOTE — Telephone Encounter (Signed)
Dr. Gaylene Brooks office is faxing labs over.

## 2014-02-04 NOTE — Telephone Encounter (Signed)
ALT of 40 is essentially normal

## 2014-02-04 NOTE — Telephone Encounter (Signed)
Pt's labs from 01/16/14 are scanned in for your reviews. ALT is elevated at 40.

## 2014-02-05 NOTE — Telephone Encounter (Signed)
Spoke w/ pt.  Advised her of Dr. Windell Hummingbird recommendation. She verbalizes understanding.  She reports that Dr. Marguerite Olea has asked her repeatedly to abstain from alcohol & Tylenol. She states that she is quite frustrated, as she does not use either of these and is uncertain why her numbers are abnormal. Had discussion w/ pt about her diet, exercise, and the need to get her diabetes in better control. Strongly encouraged to see an endocrinologist and discussed ways that they will help her.  Discussed her cholesterol numbers, she reports that she does not take statins, as she cannot tolerate them.   Pt is agreeable to scheduling an appt w/ endo and will work on her diet and try to incorporate exercise into her daily routine.  She would like to do a trial hold of her metoprolol, as her BP usually runs around 116/80, HR 88 on her wrist monitor.  Asked her to come in at her convenience to verify the accuracy of her BP monitor. Advised her to restart this if these numbers increase and to call me w/ any questions or concerns.

## 2014-04-08 ENCOUNTER — Ambulatory Visit: Payer: Self-pay | Admitting: Urgent Care

## 2014-04-27 ENCOUNTER — Ambulatory Visit: Payer: Self-pay | Admitting: Gastroenterology

## 2017-01-31 ENCOUNTER — Other Ambulatory Visit: Payer: Self-pay

## 2017-01-31 ENCOUNTER — Encounter: Payer: Self-pay | Admitting: *Deleted

## 2017-01-31 ENCOUNTER — Emergency Department: Payer: BLUE CROSS/BLUE SHIELD

## 2017-01-31 ENCOUNTER — Emergency Department
Admission: EM | Admit: 2017-01-31 | Discharge: 2017-01-31 | Disposition: A | Discharge status: Home / Self Care | Payer: BLUE CROSS/BLUE SHIELD | Attending: Emergency Medicine | Admitting: Emergency Medicine

## 2017-01-31 DIAGNOSIS — S82831A Other fracture of upper and lower end of right fibula, initial encounter for closed fracture: Secondary | ICD-10-CM

## 2017-01-31 DIAGNOSIS — F17210 Nicotine dependence, cigarettes, uncomplicated: Secondary | ICD-10-CM | POA: Diagnosis not present

## 2017-01-31 DIAGNOSIS — Z7982 Long term (current) use of aspirin: Secondary | ICD-10-CM | POA: Insufficient documentation

## 2017-01-31 DIAGNOSIS — R55 Syncope and collapse: Secondary | ICD-10-CM

## 2017-01-31 DIAGNOSIS — Z7984 Long term (current) use of oral hypoglycemic drugs: Secondary | ICD-10-CM | POA: Insufficient documentation

## 2017-01-31 DIAGNOSIS — Z79899 Other long term (current) drug therapy: Secondary | ICD-10-CM | POA: Diagnosis not present

## 2017-01-31 DIAGNOSIS — I10 Essential (primary) hypertension: Secondary | ICD-10-CM | POA: Insufficient documentation

## 2017-01-31 DIAGNOSIS — W010XXA Fall on same level from slipping, tripping and stumbling without subsequent striking against object, initial encounter: Secondary | ICD-10-CM | POA: Insufficient documentation

## 2017-01-31 DIAGNOSIS — Y929 Unspecified place or not applicable: Secondary | ICD-10-CM | POA: Insufficient documentation

## 2017-01-31 DIAGNOSIS — Y999 Unspecified external cause status: Secondary | ICD-10-CM | POA: Diagnosis not present

## 2017-01-31 DIAGNOSIS — E119 Type 2 diabetes mellitus without complications: Secondary | ICD-10-CM | POA: Diagnosis not present

## 2017-01-31 DIAGNOSIS — S8991XA Unspecified injury of right lower leg, initial encounter: Secondary | ICD-10-CM | POA: Diagnosis present

## 2017-01-31 DIAGNOSIS — Y9301 Activity, walking, marching and hiking: Secondary | ICD-10-CM | POA: Insufficient documentation

## 2017-01-31 DIAGNOSIS — S82832A Other fracture of upper and lower end of left fibula, initial encounter for closed fracture: Secondary | ICD-10-CM

## 2017-01-31 LAB — URINALYSIS, COMPLETE (UACMP) WITH MICROSCOPIC
Bilirubin Urine: NEGATIVE
Hgb urine dipstick: NEGATIVE
Ketones, ur: NEGATIVE mg/dL
LEUKOCYTES UA: NEGATIVE
Nitrite: NEGATIVE
PH: 5 (ref 5.0–8.0)
Protein, ur: NEGATIVE mg/dL
SPECIFIC GRAVITY, URINE: 1.001 — AB (ref 1.005–1.030)

## 2017-01-31 LAB — BASIC METABOLIC PANEL
Anion gap: 14 (ref 5–15)
BUN: 14 mg/dL (ref 6–20)
CO2: 21 mmol/L — ABNORMAL LOW (ref 22–32)
CREATININE: 1.07 mg/dL — AB (ref 0.44–1.00)
Calcium: 9.3 mg/dL (ref 8.9–10.3)
Chloride: 101 mmol/L (ref 101–111)
GFR calc Af Amer: >60 mL/min (ref >60)
GFR, EST NON AFRICAN AMERICAN: 56 mL/min — AB (ref >60)
GLUCOSE: 290 mg/dL — AB (ref 65–99)
Potassium: 3.5 mmol/L (ref 3.5–5.1)
SODIUM: 136 mmol/L (ref 135–145)

## 2017-01-31 LAB — CBC
HEMATOCRIT: 36.4 % (ref 35.0–47.0)
Hemoglobin: 13 g/dL (ref 12.0–16.0)
MCH: 31 pg (ref 26.0–34.0)
MCHC: 35.7 g/dL (ref 32.0–36.0)
MCV: 86.8 fL (ref 80.0–100.0)
PLATELETS: 235 10*3/uL (ref 150–440)
RBC: 4.2 MIL/uL (ref 3.80–5.20)
RDW: 14 % (ref 11.5–14.5)
WBC: 8.9 10*3/uL (ref 3.6–11.0)

## 2017-01-31 LAB — TROPONIN I: Troponin I: <0.03 ng/mL (ref <0.03)

## 2017-01-31 MED ORDER — SODIUM CHLORIDE 0.9 % IV BOLUS (SEPSIS)
1000.0000 mL | Freq: Once | INTRAVENOUS | Status: AC
  Administered 2017-01-31: 1000 mL via INTRAVENOUS

## 2017-01-31 MED ORDER — FENTANYL CITRATE (PF) 100 MCG/2ML IJ SOLN
50.0000 ug | Freq: Once | INTRAMUSCULAR | Status: AC
  Administered 2017-01-31: 50 ug via INTRAVENOUS
  Filled 2017-01-31: qty 2

## 2017-01-31 MED ORDER — OXYCODONE-ACETAMINOPHEN 5-325 MG PO TABS: 1.0000 | ORAL_TABLET | Freq: Four times a day (QID) | ORAL | 0 refills | Status: DC | PRN

## 2017-01-31 NOTE — ED Notes (Signed)
Pt st splint is comfortable and not too tight on her ankle/leg. Pt has been educated on adverse complications from splinting, including what to look for and what to do in the event there of. Pts pulse and O2 is stable and cap refill <3secs/. Pt sts understanding and has no further questions. Pt's husband is in the room at this time.

## 2017-01-31 NOTE — Discharge Instructions (Signed)
take medication as prescribed, drink plenty of fluids eat normally, follow closely with primary care doctor tomorrow, take pain medications as prescribed. Do not drive on these medications.return to the emergency room for any new or worrisome symptoms including numbness, tingling, chest pain, shortness of breath, lightheadedness. You  should take all of your medications including her diabetes medications.stay off of the affected leg, do not bear weight on that side use the crutches. Follow-up with Dr. Gardiner Fanti for an appointment on Tuesday in the [facility]  office or sooner if they can get to him.
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
## 2017-01-31 NOTE — ED Triage Notes (Signed)
Pt to ED reporting right ankle pain and swelling since last night. PT reports having had a syncopal episode last night and does not remember if she twisted ankle during fall. Pt reports feeling increased fatigue and reports having elevated blood glucose of 287 last night. Pt reports she has been stressed recently and has not been taking medication as prescribed.

## 2017-02-09 ENCOUNTER — Encounter
Admission: RE | Admit: 2017-02-09 | Discharge: 2017-02-09 | Disposition: A | Discharge status: Home / Self Care | Payer: BLUE CROSS/BLUE SHIELD | Source: Ambulatory Visit | Attending: Orthopedic Surgery | Admitting: Orthopedic Surgery

## 2017-02-09 DIAGNOSIS — F17200 Nicotine dependence, unspecified, uncomplicated: Secondary | ICD-10-CM | POA: Diagnosis not present

## 2017-02-09 DIAGNOSIS — S8261XA Displaced fracture of lateral malleolus of right fibula, initial encounter for closed fracture: Secondary | ICD-10-CM | POA: Diagnosis present

## 2017-02-09 DIAGNOSIS — Z79899 Other long term (current) drug therapy: Secondary | ICD-10-CM | POA: Diagnosis not present

## 2017-02-09 DIAGNOSIS — Z7984 Long term (current) use of oral hypoglycemic drugs: Secondary | ICD-10-CM | POA: Diagnosis not present

## 2017-02-09 DIAGNOSIS — S93431A Sprain of tibiofibular ligament of right ankle, initial encounter: Secondary | ICD-10-CM | POA: Diagnosis not present

## 2017-02-09 DIAGNOSIS — W19XXXA Unspecified fall, initial encounter: Secondary | ICD-10-CM | POA: Diagnosis not present

## 2017-02-09 DIAGNOSIS — E119 Type 2 diabetes mellitus without complications: Secondary | ICD-10-CM | POA: Diagnosis not present

## 2017-02-09 DIAGNOSIS — Z7982 Long term (current) use of aspirin: Secondary | ICD-10-CM | POA: Diagnosis not present

## 2017-02-09 DIAGNOSIS — I10 Essential (primary) hypertension: Secondary | ICD-10-CM | POA: Diagnosis not present

## 2017-02-09 HISTORY — DX: Adverse effect of unspecified anesthetic, initial encounter: T41.45XA

## 2017-02-09 HISTORY — DX: Other complications of anesthesia, initial encounter: T88.59XA

## 2017-02-09 HISTORY — DX: Nausea with vomiting, unspecified: R11.2

## 2017-02-09 HISTORY — DX: Other specified postprocedural states: Z98.890

## 2017-02-09 HISTORY — DX: Depression, unspecified: F32.A

## 2017-02-09 HISTORY — DX: Major depressive disorder, single episode, unspecified: F32.9

## 2017-02-09 HISTORY — DX: Tachycardia, unspecified: R00.0

## 2017-02-09 HISTORY — DX: Migraine, unspecified, not intractable, without status migrainosus: G43.909

## 2017-02-09 NOTE — Patient Instructions (Signed)
Your procedure is scheduled on: Monday, February 12, 2017 Report to Same Day Surgery on the 2nd floor in the [facility] To find out your arrival time, please call [phone] between 1PM - 3PM on: Friday, February 09, 2017  REMEMBER: Instructions that are not followed completely may result in serious medical risk up to and including death; or upon the discretion of your surgeon and anesthesiologist your surgery may need to be rescheduled.  Do not eat food or drink liquids after midnight. No gum chewing or hard candies.  You may however, drink WATER ONLY up to 2 hours before you are scheduled to arrive at the hospital for your procedure.  Do not drink ANY WATER within 2 hours of your scheduled arrival to the hospital as this may lead to your procedure being delayed or rescheduled.  No Alcohol for 24 hours before or after surgery.  No Smoking for 24 hours prior to surgery.  Notify your doctor if there is any change in your medical condition (cold, fever, infection).  Do not wear jewelry, make-up, hairpins, clips or nail polish.  Do not wear lotions, powders, or perfumes.   Do not shave 48 hours prior to surgery.   Contacts and dentures may not be worn into surgery.  Do not bring valuables to the hospital. [facility] is not responsible for any belongings or valuables.   TAKE THESE MEDICATIONS THE MORNING OF SURGERY WITH A SIP OF WATER:  1.  NONE  Use CHG Soap or wipes as directed on instruction sheet.  Stop Metformin 2 days prior to surgery. (LAST DOSE OF METFORMIN TODAY, September 28). Continue after surgery.  NOW!  Stop Anti-inflammatories such as Advil, Aleve, Ibuprofen, Motrin, Naproxen, Naprosyn, Goodie powder, or aspirin products. (May take Tylenol or Acetaminophen if needed.)  NOW!  Stop OVER THE COUNTER supplements until after surgery.  If you are being discharged the day of surgery, you will not be allowed to drive home. You will need someone to drive you home and  stay with you that night.   If you are taking public transportation, you will need to have a responsible adult to with you.  Please call the number above if you have any questions about these instructions.

## 2017-02-09 NOTE — Pre-Procedure Instructions (Signed)
Copy and pasted EKG interpretation from Emergency Room visit on 01/31/2017:  EKG  I personally interpreted any EKGs ordered by me or triage  sinus tach rate 123, and acute ST elevation or depression unremarkable EKG from nonspecific ST changes  Andrea Plant, MD 01/31/17 1710  Dr. Noralyn Pick (anesthesiologist) made aware of patient's upcoming surgery, history and EKG. After review of patient's record, was okay to proceed with surgery at this time.

## 2017-02-11 MED ORDER — CEFAZOLIN SODIUM-DEXTROSE 2-4 GM/100ML-% IV SOLN
2.0000 g | Freq: Once | INTRAVENOUS | Status: AC
  Administered 2017-02-12: 2 g via INTRAVENOUS

## 2017-02-12 ENCOUNTER — Encounter
Admission: RE | Disposition: A | Discharge status: Home / Self Care | Payer: Self-pay | Source: Ambulatory Visit | Attending: Orthopedic Surgery

## 2017-02-12 ENCOUNTER — Ambulatory Visit
Admission: RE | Admit: 2017-02-12 | Discharge: 2017-02-12 | Disposition: A | Discharge status: Home / Self Care | Payer: BLUE CROSS/BLUE SHIELD | Source: Ambulatory Visit | Attending: Orthopedic Surgery | Admitting: Orthopedic Surgery

## 2017-02-12 ENCOUNTER — Ambulatory Visit: Payer: BLUE CROSS/BLUE SHIELD | Admitting: Anesthesiology

## 2017-02-12 ENCOUNTER — Ambulatory Visit: Payer: BLUE CROSS/BLUE SHIELD

## 2017-02-12 DIAGNOSIS — Z79899 Other long term (current) drug therapy: Secondary | ICD-10-CM | POA: Insufficient documentation

## 2017-02-12 DIAGNOSIS — Z7982 Long term (current) use of aspirin: Secondary | ICD-10-CM | POA: Insufficient documentation

## 2017-02-12 DIAGNOSIS — F17200 Nicotine dependence, unspecified, uncomplicated: Secondary | ICD-10-CM | POA: Insufficient documentation

## 2017-02-12 DIAGNOSIS — S8261XA Displaced fracture of lateral malleolus of right fibula, initial encounter for closed fracture: Secondary | ICD-10-CM | POA: Diagnosis not present

## 2017-02-12 DIAGNOSIS — Z419 Encounter for procedure for purposes other than remedying health state, unspecified: Secondary | ICD-10-CM

## 2017-02-12 DIAGNOSIS — S93431A Sprain of tibiofibular ligament of right ankle, initial encounter: Secondary | ICD-10-CM | POA: Insufficient documentation

## 2017-02-12 DIAGNOSIS — Z7984 Long term (current) use of oral hypoglycemic drugs: Secondary | ICD-10-CM | POA: Insufficient documentation

## 2017-02-12 DIAGNOSIS — W19XXXA Unspecified fall, initial encounter: Secondary | ICD-10-CM | POA: Insufficient documentation

## 2017-02-12 DIAGNOSIS — I10 Essential (primary) hypertension: Secondary | ICD-10-CM | POA: Insufficient documentation

## 2017-02-12 DIAGNOSIS — E119 Type 2 diabetes mellitus without complications: Secondary | ICD-10-CM | POA: Insufficient documentation

## 2017-02-12 HISTORY — PX: ORIF ANKLE FRACTURE: SHX5408

## 2017-02-12 HISTORY — PX: SYNDESMOSIS REPAIR: SHX5182

## 2017-02-12 LAB — GLUCOSE, CAPILLARY
Glucose-Capillary: 238 mg/dL — ABNORMAL HIGH (ref 65–99)
Glucose-Capillary: 236 mg/dL — ABNORMAL HIGH (ref 65–99)

## 2017-02-12 SURGERY — OPEN REDUCTION INTERNAL FIXATION (ORIF) ANKLE FRACTURE
Anesthesia: General | Laterality: Right

## 2017-02-12 MED ORDER — DEXAMETHASONE SODIUM PHOSPHATE 10 MG/ML IJ SOLN
INTRAMUSCULAR | Status: AC
  Filled 2017-02-12: qty 1

## 2017-02-12 MED ORDER — SODIUM CHLORIDE 0.9 % IV SOLN
INTRAVENOUS | Status: DC
  Administered 2017-02-12: 10:00:00 via INTRAVENOUS

## 2017-02-12 MED ORDER — PROPOFOL 10 MG/ML IV BOLUS
INTRAVENOUS | Status: AC
  Filled 2017-02-12: qty 20

## 2017-02-12 MED ORDER — CEFAZOLIN SODIUM-DEXTROSE 2-4 GM/100ML-% IV SOLN
INTRAVENOUS | Status: AC
  Filled 2017-02-12: qty 100

## 2017-02-12 MED ORDER — LIDOCAINE HCL (PF) 1 % IJ SOLN
INTRAMUSCULAR | Status: AC
  Filled 2017-02-12: qty 5

## 2017-02-12 MED ORDER — TRAMADOL HCL 50 MG PO TABS
50.0000 mg | ORAL_TABLET | Freq: Once | ORAL | Status: AC
  Administered 2017-02-12: 50 mg via ORAL

## 2017-02-12 MED ORDER — PROPOFOL 10 MG/ML IV BOLUS
INTRAVENOUS | Status: DC | PRN
  Administered 2017-02-12: 120 mg via INTRAVENOUS

## 2017-02-12 MED ORDER — ACETAMINOPHEN 10 MG/ML IV SOLN
INTRAVENOUS | Status: AC
  Filled 2017-02-12: qty 100

## 2017-02-12 MED ORDER — ONDANSETRON HCL 4 MG/2ML IJ SOLN
INTRAMUSCULAR | Status: DC | PRN
  Administered 2017-02-12: 4 mg via INTRAVENOUS

## 2017-02-12 MED ORDER — NEOMYCIN-POLYMYXIN B GU 40-200000 IR SOLN
Status: AC
Start: 2017-02-12 — End: 2017-02-12
  Filled 2017-02-12: qty 4

## 2017-02-12 MED ORDER — ONDANSETRON HCL 4 MG/2ML IJ SOLN
4.0000 mg | Freq: Once | INTRAMUSCULAR | Status: AC | PRN
  Administered 2017-02-12: 4 mg via INTRAVENOUS

## 2017-02-12 MED ORDER — LIDOCAINE HCL (PF) 2 % IJ SOLN
INTRAMUSCULAR | Status: AC
  Filled 2017-02-12: qty 6

## 2017-02-12 MED ORDER — FENTANYL CITRATE (PF) 100 MCG/2ML IJ SOLN
INTRAMUSCULAR | Status: AC
  Filled 2017-02-12: qty 2

## 2017-02-12 MED ORDER — SCOPOLAMINE 1 MG/3DAYS TD PT72
MEDICATED_PATCH | TRANSDERMAL | Status: AC
  Filled 2017-02-12: qty 1

## 2017-02-12 MED ORDER — FENTANYL CITRATE (PF) 100 MCG/2ML IJ SOLN
25.0000 ug | INTRAMUSCULAR | Status: DC | PRN
  Administered 2017-02-12 (×2): 25 ug via INTRAVENOUS

## 2017-02-12 MED ORDER — DEXAMETHASONE SODIUM PHOSPHATE 4 MG/ML IJ SOLN
INTRAMUSCULAR | Status: DC | PRN
  Administered 2017-02-12: 5 mg via INTRAVENOUS

## 2017-02-12 MED ORDER — ONDANSETRON HCL 4 MG/2ML IJ SOLN
INTRAMUSCULAR | Status: AC
  Filled 2017-02-12: qty 2

## 2017-02-12 MED ORDER — ROPIVACAINE HCL 5 MG/ML IJ SOLN
INTRAMUSCULAR | Status: AC
  Filled 2017-02-12: qty 30

## 2017-02-12 MED ORDER — NEOMYCIN-POLYMYXIN B GU 40-200000 IR SOLN
Status: DC | PRN
  Administered 2017-02-12: 4 mL

## 2017-02-12 MED ORDER — LIDOCAINE HCL (CARDIAC) 20 MG/ML IV SOLN
INTRAVENOUS | Status: DC | PRN
  Administered 2017-02-12: 100 mg via INTRAVENOUS

## 2017-02-12 MED ORDER — BUPIVACAINE-EPINEPHRINE (PF) 0.25% -1:200000 IJ SOLN
INTRAMUSCULAR | Status: AC
  Filled 2017-02-12: qty 30

## 2017-02-12 MED ORDER — FAMOTIDINE 20 MG PO TABS
20.0000 mg | ORAL_TABLET | Freq: Once | ORAL | Status: AC
  Administered 2017-02-12: 20 mg via ORAL

## 2017-02-12 MED ORDER — GLYCOPYRROLATE 0.2 MG/ML IJ SOLN
INTRAMUSCULAR | Status: DC | PRN
  Administered 2017-02-12: 0.2 mg via INTRAVENOUS

## 2017-02-12 MED ORDER — MIDAZOLAM HCL 2 MG/2ML IJ SOLN
1.5000 mg | Freq: Once | INTRAMUSCULAR | Status: AC
  Administered 2017-02-12: 1.5 mg via INTRAVENOUS

## 2017-02-12 MED ORDER — ONDANSETRON 4 MG PO TBDP: 4.0000 mg | ORAL_TABLET | Freq: Three times a day (TID) | ORAL | 0 refills | Status: DC | PRN

## 2017-02-12 MED ORDER — MIDAZOLAM HCL 2 MG/2ML IJ SOLN
INTRAMUSCULAR | Status: AC
  Administered 2017-02-12: 1.5 mg via INTRAVENOUS
  Filled 2017-02-12: qty 2

## 2017-02-12 MED ORDER — ACETAMINOPHEN 10 MG/ML IV SOLN
INTRAVENOUS | Status: DC | PRN
  Administered 2017-02-12: 1000 mg via INTRAVENOUS

## 2017-02-12 MED ORDER — GLYCOPYRROLATE 0.2 MG/ML IJ SOLN
INTRAMUSCULAR | Status: AC
  Filled 2017-02-12: qty 1

## 2017-02-12 MED ORDER — FENTANYL CITRATE (PF) 250 MCG/5ML IJ SOLN
INTRAMUSCULAR | Status: AC
  Filled 2017-02-12: qty 5

## 2017-02-12 MED ORDER — FENTANYL CITRATE (PF) 100 MCG/2ML IJ SOLN
INTRAMUSCULAR | Status: DC | PRN
  Administered 2017-02-12: 25 ug via INTRAVENOUS
  Administered 2017-02-12: 50 ug via INTRAVENOUS
  Administered 2017-02-12 (×2): 25 ug via INTRAVENOUS
  Administered 2017-02-12: 50 ug via INTRAVENOUS
  Administered 2017-02-12: 25 ug via INTRAVENOUS
  Administered 2017-02-12: 50 ug via INTRAVENOUS

## 2017-02-12 MED ORDER — TRAMADOL HCL 50 MG PO TABS
ORAL_TABLET | ORAL | Status: AC
  Filled 2017-02-12: qty 1

## 2017-02-12 MED ORDER — SCOPOLAMINE 1 MG/3DAYS TD PT72
1.0000 | MEDICATED_PATCH | Freq: Once | TRANSDERMAL | Status: DC
  Administered 2017-02-12: 1.5 mg via TRANSDERMAL

## 2017-02-12 MED ORDER — ASPIRIN EC 325 MG PO TBEC: 325.0000 mg | DELAYED_RELEASE_TABLET | Freq: Every day | ORAL | 0 refills | Status: AC

## 2017-02-12 MED ORDER — FAMOTIDINE 20 MG PO TABS
ORAL_TABLET | ORAL | Status: AC
  Filled 2017-02-12: qty 1

## 2017-02-12 MED ORDER — TRAMADOL HCL 50 MG PO TABS: 50.0000 mg | ORAL_TABLET | ORAL | 0 refills | Status: AC | PRN

## 2017-02-12 MED ORDER — KETAMINE HCL 50 MG/ML IJ SOLN
INTRAMUSCULAR | Status: DC | PRN
  Administered 2017-02-12: 50 mg via INTRAVENOUS

## 2017-02-12 SURGICAL SUPPLY — 61 items
BANDAGE ELASTIC 4 LF NS (GAUZE/BANDAGES/DRESSINGS) ×6 IMPLANT
BIT DRILL 2.5X110 QC LCP DISP (BIT) ×3 IMPLANT
BIT DRILL LCP QC 2X140 (BIT) ×3 IMPLANT
BIT DRILL LONG 2.7 (BIT) ×1 IMPLANT
BLADE SURG 15 STRL LF DISP TIS (BLADE) ×1 IMPLANT
BLADE SURG 15 STRL SS (BLADE) ×2
BNDG COHESIVE 4X5 TAN STRL (GAUZE/BANDAGES/DRESSINGS) IMPLANT
BNDG ESMARK 6X12 TAN STRL LF (GAUZE/BANDAGES/DRESSINGS) ×3 IMPLANT
CANISTER SUCT 1200ML W/VALVE (MISCELLANEOUS) ×3 IMPLANT
CHLORAPREP W/TINT 26ML (MISCELLANEOUS) ×3 IMPLANT
CUFF TOURN 24 STER (MISCELLANEOUS) ×3 IMPLANT
CUFF TOURN 30 STER DUAL PORT (MISCELLANEOUS) IMPLANT
DRAPE C-ARM XRAY 36X54 (DRAPES) ×3 IMPLANT
DRAPE INCISE IOBAN 66X45 STRL (DRAPES) ×3 IMPLANT
DRAPE U-SHAPE 47X51 STRL (DRAPES) ×3 IMPLANT
DRILL BIT LONG 2.7 (BIT) ×3
DRSG TEGADERM 4X4.75 (GAUZE/BANDAGES/DRESSINGS) ×3 IMPLANT
ELECT REM PT RETURN 9FT ADLT (ELECTROSURGICAL) ×3
ELECTRODE REM PT RTRN 9FT ADLT (ELECTROSURGICAL) ×1 IMPLANT
GAUZE PETRO XEROFOAM 1X8 (MISCELLANEOUS) ×3 IMPLANT
GAUZE SPONGE 4X4 12PLY STRL (GAUZE/BANDAGES/DRESSINGS) ×3 IMPLANT
GLOVE BIOGEL PI IND STRL 8 (GLOVE) ×1 IMPLANT
GLOVE BIOGEL PI INDICATOR 8 (GLOVE) ×2
GLOVE SURG SYN 7.5  E (GLOVE) ×4
GLOVE SURG SYN 7.5 E (GLOVE) ×2 IMPLANT
GOWN STRL REUS W/ TWL LRG LVL3 (GOWN DISPOSABLE) ×2 IMPLANT
GOWN STRL REUS W/ TWL XL LVL3 (GOWN DISPOSABLE) ×1 IMPLANT
GOWN STRL REUS W/TWL LRG LVL3 (GOWN DISPOSABLE) ×4
GOWN STRL REUS W/TWL XL LVL3 (GOWN DISPOSABLE) ×2
KIT RM TURNOVER STRD PROC AR (KITS) ×3 IMPLANT
LABEL OR SOLS (LABEL) ×3 IMPLANT
NS IRRIG 1000ML POUR BTL (IV SOLUTION) ×3 IMPLANT
PACK EXTREMITY ARMC (MISCELLANEOUS) ×3 IMPLANT
PAD ABD DERMACEA PRESS 5X9 (GAUZE/BANDAGES/DRESSINGS) IMPLANT
PAD CAST CTTN 4X4 STRL (SOFTGOODS) ×3 IMPLANT
PADDING CAST COTTON 4X4 STRL (SOFTGOODS) ×6
PLATE 4HOLE DISTAL FIB R 2.7 (Plate) ×3 IMPLANT
REPAIR TROPE KNTLS SS SYNDESMO (Orthopedic Implant) ×3 IMPLANT
SCREW CORT ST T8 SD REC 2.7X20 (Screw) ×3 IMPLANT
SCREW CORT T15 12X3.5XST (Screw) ×1 IMPLANT
SCREW CORTEX 2.7 SLF-TPNG 16MM (Screw) ×3 IMPLANT
SCREW CORTEX 2.7 SLF-TPNG 18MM (Screw) ×3 IMPLANT
SCREW CORTEX 2.7X22 ST (Screw) ×1 IMPLANT
SCREW CORTEX 3.5X12MM (Screw) ×2 IMPLANT
SCREW CORTICAL 2.7X24MM (Screw) ×3 IMPLANT
SCREW LOCK VA ST 2.7X10 (Screw) ×3 IMPLANT
SCREW LOCK VA ST 2.7X14 (Screw) ×3 IMPLANT
SCREW LOCK VA ST 2.7X20 (Screw) ×3 IMPLANT
SCREW LOCKING 2.7X16MM VA (Screw) ×9 IMPLANT
SCREW NON LOCK 2.7X16MM (Screw) ×3 IMPLANT
SCREW SELF TAP 12M (Screw) ×6 IMPLANT
SCREW SELF TAP 22M (Screw) ×2 IMPLANT
SPLINT CAST 1 STEP 4X30 (MISCELLANEOUS) ×3 IMPLANT
SPLINT FAST PLASTER 5X30 (CAST SUPPLIES)
SPLINT PLASTER CAST FAST 5X30 (CAST SUPPLIES) IMPLANT
SPONGE LAP 18X18 5 PK (GAUZE/BANDAGES/DRESSINGS) ×3 IMPLANT
STAPLER SKIN PROX 35W (STAPLE) ×3 IMPLANT
STOCKINETTE STRL 6IN 960660 (GAUZE/BANDAGES/DRESSINGS) ×3 IMPLANT
SUT VIC AB 2-0 SH 27 (SUTURE) ×2
SUT VIC AB 2-0 SH 27XBRD (SUTURE) ×1 IMPLANT
SYR 30ML LL (SYRINGE) ×3 IMPLANT

## 2017-02-12 NOTE — Anesthesia Post-op Follow-up Note (Signed)
Anesthesia QCDR form completed.        

## 2017-02-12 NOTE — Anesthesia Procedure Notes (Signed)
Procedure Name: LMA Insertion Date/Time: 02/12/2017 10:50 AM Performed by: Shirlee Limerick, Stancil Deisher Pre-anesthesia Checklist: Patient identified, Emergency Drugs available, Suction available and Patient being monitored Patient Re-evaluated:Patient Re-evaluated prior to induction Oxygen Delivery Method: Circle system utilized Preoxygenation: Pre-oxygenation with 100% oxygen Induction Type: IV induction LMA: LMA inserted LMA Size: 4.0 Number of attempts: 1 Placement Confirmation: positive ETCO2 and breath sounds checked- equal and bilateral Tube secured with: Tape Dental Injury: Teeth and Oropharynx as per pre-operative assessment

## 2017-02-12 NOTE — Transfer of Care (Signed)
Immediate Anesthesia Transfer of Care Note  [name]  Procedure(s) Performed: OPEN REDUCTION INTERNAL FIXATION (ORIF) ANKLE FRACTURE (Right ) SYNDESMOSIS REPAIR (Right )  Patient Location: PACU  Anesthesia Type:General  Level of Consciousness: awake and patient cooperative  Airway & Oxygen Therapy: Patient Spontanous Breathing and Patient connected to face mask oxygen  Post-op Assessment: Report given to RN and Post -op Vital signs reviewed and stable  Post vital signs: Reviewed and stable  Last Vitals:  Vitals:   02/12/17 1028 02/12/17 1033  BP: (!) 122/57 102/61  Pulse: 82 86  Resp: (!) 23 (!) 27  SpO2: 100% 100%    Last Pain: There were no vitals filed for this visit.       Complications: No apparent anesthesia complications

## 2017-02-12 NOTE — Discharge Instructions (Signed)
Ankle Fracture Surgery °  °Post-Op Instructions °  °1. Bracing or crutches: Crutches will be provided at the time of discharge. °  °2. Splint/Cast: You will have a splint (3/4 cast) on your leg after surgery. Ensure that this remains clean and dry until follow up appointment. If this becomes wet, you need to call our offices to get it changed or else you risk skin breakdown.   °  °3. Driving:  Plan on not driving for at least four to six weeks. Please note that you are advised NOT to drive while taking narcotic pain medications as you may be impaired and unsafe to drive. °  °4. Activity: Ankle pumps several times an hour while awake to prevent blood clots. Weight bearing: Non-weight bearing. Use crutches for at least 6 weeks, if not longer based on your surgery. Bending and straightening the knee is unlimited. Elevate knee above heart level as much as possible for one week. Avoid standing more than 5 minutes (consecutively) for the first week.  Avoid long distance travel for 4 weeks. °  °5. Medications:  °- You have been provided a prescription for narcotic pain medicine. After surgery, take 1-2 narcotic tablets every 4 hours if needed for severe pain. Please start this as soon as you begin to start having pain (if you received a nerve block, start taking as soon as this wears off).  °- A prescription for anti-nausea medication will be provided in case the narcotic medicine causes nausea - take 1 tablet every 6 hours only if nauseated.  °- Take enteric coated aspirin 325 mg once daily for 4 weeks to prevent blood clots.  °-Take tylenol 1000 mg every 8 hours for pain.  May stop tylenol 5 days after surgery if you are having minimal pain. °  °If you are taking prescription medication for anxiety, depression, insomnia, muscle spasm, chronic pain, or for attention deficit disorder you are advised that you are at a higher risk of adverse effects with use of narcotics post-op, including narcotic addiction/dependence,  depressed breathing, death. °If you use non-prescribed substances: alcohol, marijuana, cocaine, heroin, methamphetamines, etc., you are at a higher risk of adverse effects with use of narcotics post-op, including narcotic addiction/dependence, depressed breathing, death. °You are advised that taking > 50 morphine milligram equivalents (MME) of narcotic pain medication per day results in twice the risk of overdose or death. For your prescription provided: oxycodone 5 mg - taking more than 6 tablets per day. Be advised that we will prescribe narcotics short-term, for acute post-operative pain only - 1 week for minor operations such as knee arthroscopy for meniscus tear resection, and 3 weeks for major operations such as knee repair/reconstruction surgeries.  °  °6. Physical Therapy: Plan to start after follow up appointment at 2 weeks. 1-2 times per week for ~12-16 weeks. Therapy typically starts on post operative Day 3 or 4. You have been provided an order for physical therapy. The therapist will provide home exercises. Please contact our offices if this appointment has not been scheduled.  °  °7. Work/School: May return to full work when off of crutches. May do light duty/desk job or return to school in approximately 1-2 weeks when off of narcotics, pain is well-controlled, and swelling has decreased. °  °8. Post-Op Appointments: °Your first post-op appointment will be with Dr. Adolphus Hanf in approximately 2 weeks time.  °  °If you find that they have not been scheduled please call the Orthopaedic Appointment front desk at [phone]. ° ° ° ° ° °

## 2017-02-12 NOTE — Anesthesia Procedure Notes (Addendum)
Anesthesia Regional Block: Popliteal block   Pre-Anesthetic Checklist: ,, timeout performed, Correct Patient, Correct Site, Correct Laterality, Correct Procedure, Correct Position, site marked, Risks and benefits discussed,  Surgical consent,  Pre-op evaluation,  At surgeon's request and post-op pain management  Laterality: Lower and Right  Prep: chloraprep       Needles:  Injection technique: Single-shot  Needle Type: Echogenic Stimulator Needle     Needle Length: 9cm  Needle Gauge: 21     Additional Needles:   Procedures:,,,, ultrasound used (permanent image in chart),,,,  Narrative:  Start time: 02/12/2017 10:25 AM Injection made incrementally with aspirations every 5 mL.  Performed by: Personally  Anesthesiologist: Cash Meadow G  Additional Notes: Functioning IV was confirmed and monitors were applied.  A echogenic needle was used. Sterile prep,hand hygiene and sterile gloves were used. Minimal sedation used for procedure.   No paresthesia endorsed by patient during the procedure.  Negative aspiration and negative test dose prior to incremental administration of local anesthetic. The patient tolerated the procedure well with no immediate complications.      

## 2017-02-12 NOTE — Anesthesia Preprocedure Evaluation (Signed)
Anesthesia Evaluation  Patient identified by MRN, date of birth, ID band Patient awake    Reviewed: Allergy & Precautions, H&P , NPO status , Patient's Chart, lab work & pertinent test results, reviewed documented beta blocker date and time   History of Anesthesia Complications (+) PONV and history of anesthetic complications  Airway Mallampati: II  TM Distance: >3 FB Neck ROM: full    Dental  (+) Teeth Intact, Poor Dentition   Pulmonary neg pulmonary ROS, Current Smoker,    Pulmonary exam normal        Cardiovascular Exercise Tolerance: Poor hypertension, On Medications negative cardio ROS Normal cardiovascular exam Rate:Normal     Neuro/Psych  Headaches, PSYCHIATRIC DISORDERS negative neurological ROS  negative psych ROS   GI/Hepatic negative GI ROS, Neg liver ROS,   Endo/Other  negative endocrine ROSdiabetes  Renal/GU negative Renal ROS  negative genitourinary   Musculoskeletal   Abdominal   Peds  Hematology negative hematology ROS (+)   Anesthesia Other Findings   Reproductive/Obstetrics negative OB ROS                             Anesthesia Physical Anesthesia Plan  ASA: III  Anesthesia Plan: General LMA   Post-op Pain Management:  Regional for Post-op pain   Induction:   PONV Risk Score and Plan: 4 or greater and Ondansetron, Dexamethasone, Midazolam and Propofol infusion  Airway Management Planned:   Additional Equipment:   Intra-op Plan:   Post-operative Plan:   Informed Consent: I have reviewed the patients History and Physical, chart, labs and discussed the procedure including the risks, benefits and alternatives for the proposed anesthesia with the patient or authorized representative who has indicated his/her understanding and acceptance.     Plan Discussed with: CRNA  Anesthesia Plan Comments:         Anesthesia Quick Evaluation

## 2017-02-12 NOTE — H&P (Signed)
Paper H&P to be scanned into permanent record. H&P reviewed. No significant changes noted.  

## 2017-02-13 ENCOUNTER — Encounter: Payer: Self-pay | Admitting: Orthopedic Surgery

## 2017-02-13 NOTE — Op Note (Signed)
Operative Note   SURGERY DATE: 02/13/2017  PRE-OP DIAGNOSIS:  1. Distal fibula fracture of R ankle 2. Syndesmosis injury of R ankle   POST-OP DIAGNOSIS:  1. Distal fibula fracture of R ankle 2. Syndesmosis injury of R ankle  PROCEDURE(S): 1. ORIF R distal fibula 2. Repair of R ankle syndesmosis  SURGEON: Rosealee Albee, MD   ANESTHESIA: Regional  + Gen  ESTIMATED BLOOD LOSS: 25cc  DRAINS:  None  TOTAL IV FLUIDS: see anesthesia record  IMPLANTS: Synthes distal fibula LCP 2 - 2.27mm Synthes lag screws 2.82mm Synthes locking screws and cortical screws 3.8mm Synthes cortical screws Arthrex syndesmosis tightrope   INDICATION(S): The patient is a 61 y.o. female who had a fall ~2 weeks ago and noted immediate ankle pain. Radiographs showed a lateral malleolus fracture with lateral displacement of the talus as well as a proximal fibula fracture.   OPERATIVE FINDINGS: Distal fibula fracture and syndesmosis injury.  OPERATIVE REPORT:   The patient was seen in the Holding Room. The risks, benefits, complications, treatment options, and expected outcomes were discussed with the patient. The risks and potential complications of the problem and purposed treatment include but are not limited to infection, bleeding, pain, stiffness, nerve and vessel injury and complication secondary to the anesthetic. The patient concurred with the proposed plan, giving informed consent.  The site of surgery was properly noted/marked. A peripheral nerve block was administered by the Anesthesia team.  The patient was taken to Operating Room and transferred to the operating room table. A Time Out was held and the patient identity, procedure, and laterality was confirmed. After administration of adequate anesthesia, the entire lower extremity was prescrubbed with Hibiclens and alcohol, prepped with Chloroprep, and draped in sterile fashion. The patient was given pre-operative IV antibiotics within 30  minutes of the skin incision.  The tourniquet was inflated to after exsanguinating the leg with an Esmarch bandage. A standard distal fibular incision was made with a 15 blade along the lateral aspect of the fibula. Dissection was carried down sharply to the fibula. The fracture site was identified and cleared of any tissue with a combination of dental pick, knife, and curette.  A pointed reduction clamp was placed and the fracture was reduced. Anatomic reduction was confirmed visually and fluoroscopically. The fracture line was long enough to accommodate two Synthes 2.78mm lag screws in an A-P fashion. The pointed reduction clamp was removed and the fracture remained anatomically reduced.   A Synthes distal fibula LCP plate was selected after confirming appropriate size and position fluoroscopically. Two locking screws and one cortical screw were placed in the distal fragment. Then 3 additional cortical screws were placed in the proximal fragment. Fluoroscopy then confirmed appropriate hardware position and reduction. External rotation stress view was positive.    Therefore, we turned our attention to they syndesmosis repair. A 3.64mm drill was used to drill across both the fibula and tibia, confirming appropriate position with fluoroscopically. A syndesmosis tightrope was passed through with a needle, out through the medial skin. The medial button was pulled back and flipped with confirmation with fluoroscopy. The button on the lateral side was advanced, tightening the tightrope until it was flush against the bone. The button sat deep to the plate on the lateral fibula itself. Once final tightening was achieved, we performed repeat external rotation stress test. It was now negative.   The wound was then thoroughly irrigated. The tourniquet was deflated with a time of 120 minutes. Hemostasis was achieved  with bovie electrocautery. 0 Vicryl sutures were used to close the deep layer over the plate.  3-0 Vicryl was used to close the subdermal layer. 3-0 Nylon was used to close the skin in a horizontal mattress fashion. The wound was dressed with xeroform, fluffs, and cotton guaze.  The leg was then placed in a short leg splint. The patient was awakened from anesthesia without any further complication and transferred to PACU for further recovery.    POST-OPERATIVE PLAN:  Patient will be discharged to home. The patient will be NWB for 6 weeks. ASA x 4 weeks for DVT ppx. Follow up in 2 weeks as an outpatient. Transition to walking boot at that time. Start PT after 2 week appointment.

## 2017-02-13 NOTE — Anesthesia Postprocedure Evaluation (Signed)
Anesthesia Post Note  [name]  Procedure(s) Performed: OPEN REDUCTION INTERNAL FIXATION (ORIF) ANKLE FRACTURE (Right ) SYNDESMOSIS REPAIR (Right )  Patient location during evaluation: PACU Anesthesia Type: General Level of consciousness: awake and alert Pain management: pain level controlled Vital Signs Assessment: post-procedure vital signs reviewed and stable Respiratory status: spontaneous breathing, nonlabored ventilation, respiratory function stable and patient connected to nasal cannula oxygen Cardiovascular status: blood pressure returned to baseline and stable Postop Assessment: no apparent nausea or vomiting Anesthetic complications: no     Last Vitals:  Vitals:   02/12/17 1545 02/12/17 1620  BP: (!) 141/74 (!) 143/73  Pulse: 89 85  Resp: 14 16  Temp: 36.6 C   SpO2: 97% 99%    Last Pain:  Vitals:   02/13/17 0827  PainSc: 3                  Yevette Edwards

## 2018-06-04 IMAGING — CR DG C-ARM 61-120 MIN
5 series · 5 of 5 positions shown · non-contrast
Comparison: January 31, 2017

FLUOROSCOPY TIME:  0 minutes 35 seconds; 5 acquired images

CLINICAL DATA: Status post open reduction internal fixation for
fracture

EXAM:
RIGHT ANKLE - 2 VIEW; DG C-ARM 61-120 MIN

[p5]
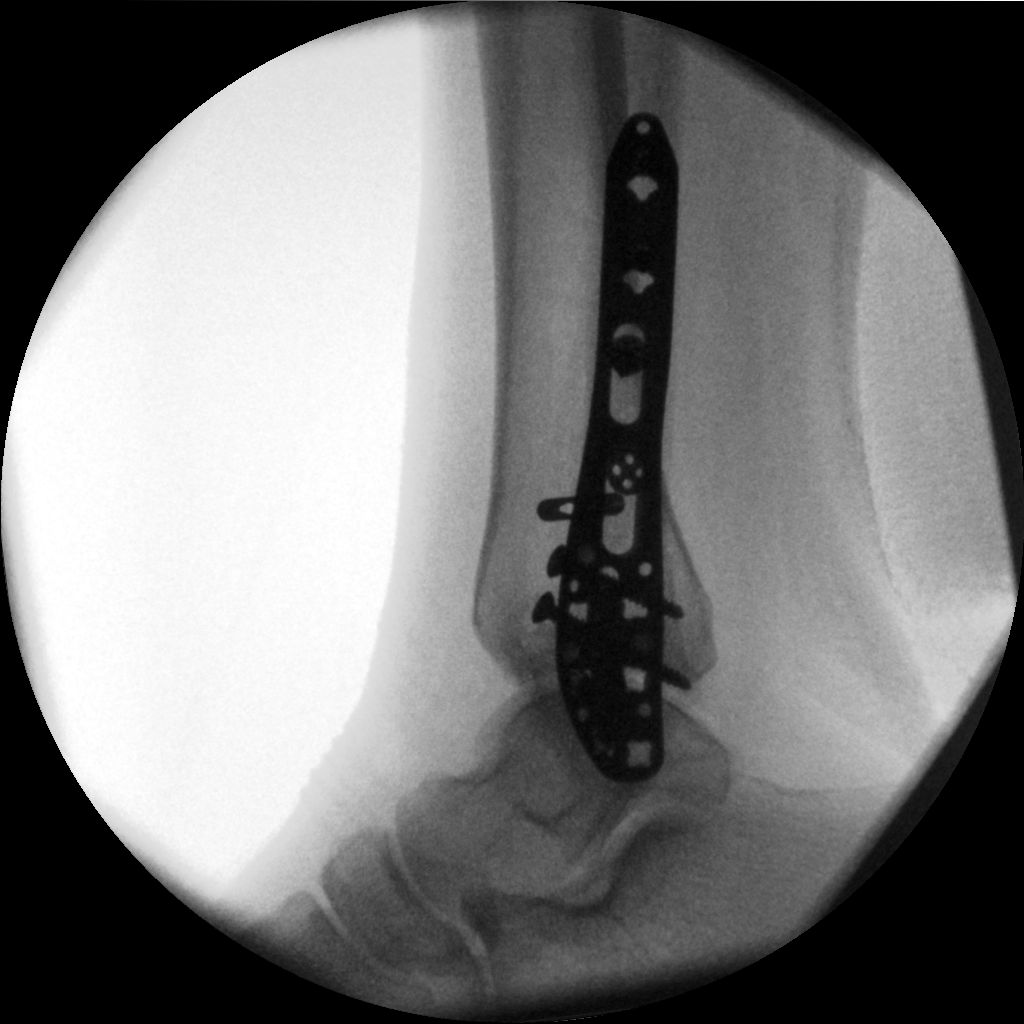

[p4]
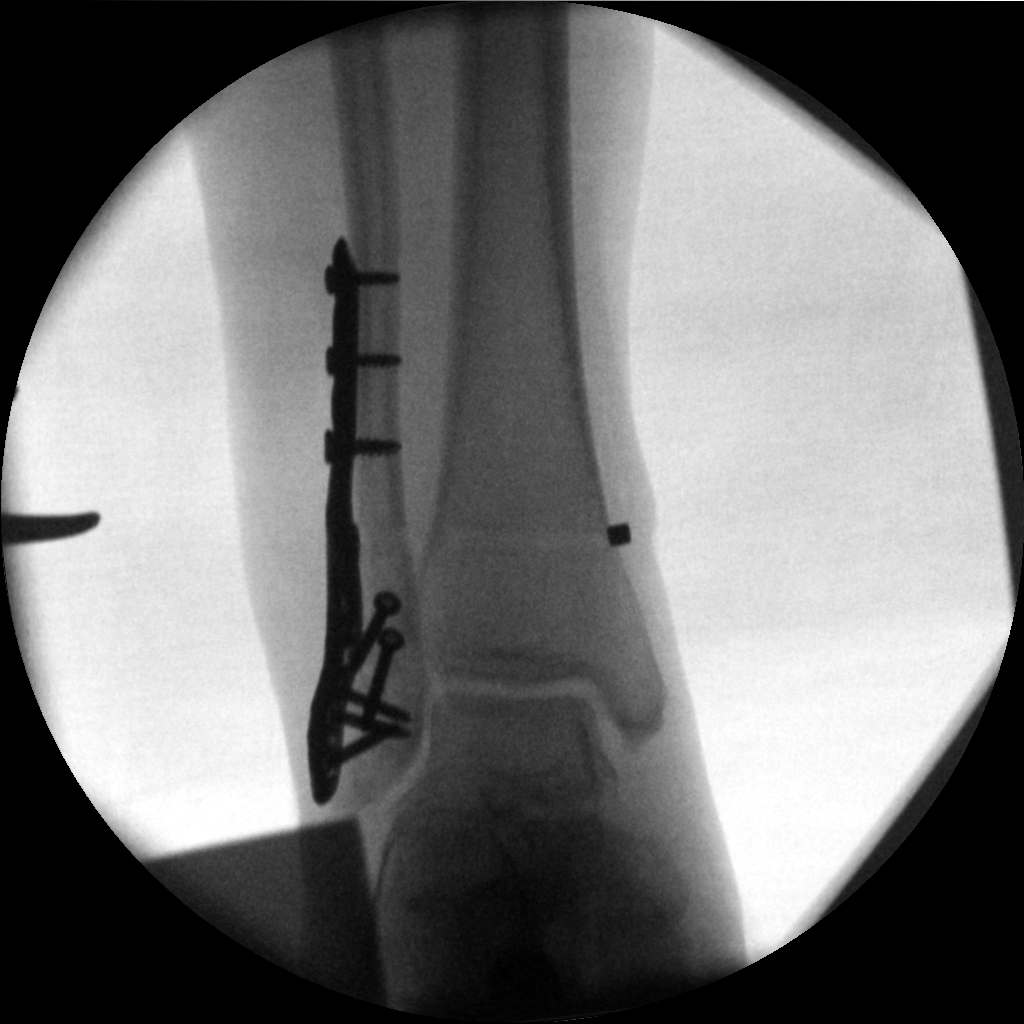

[p1]
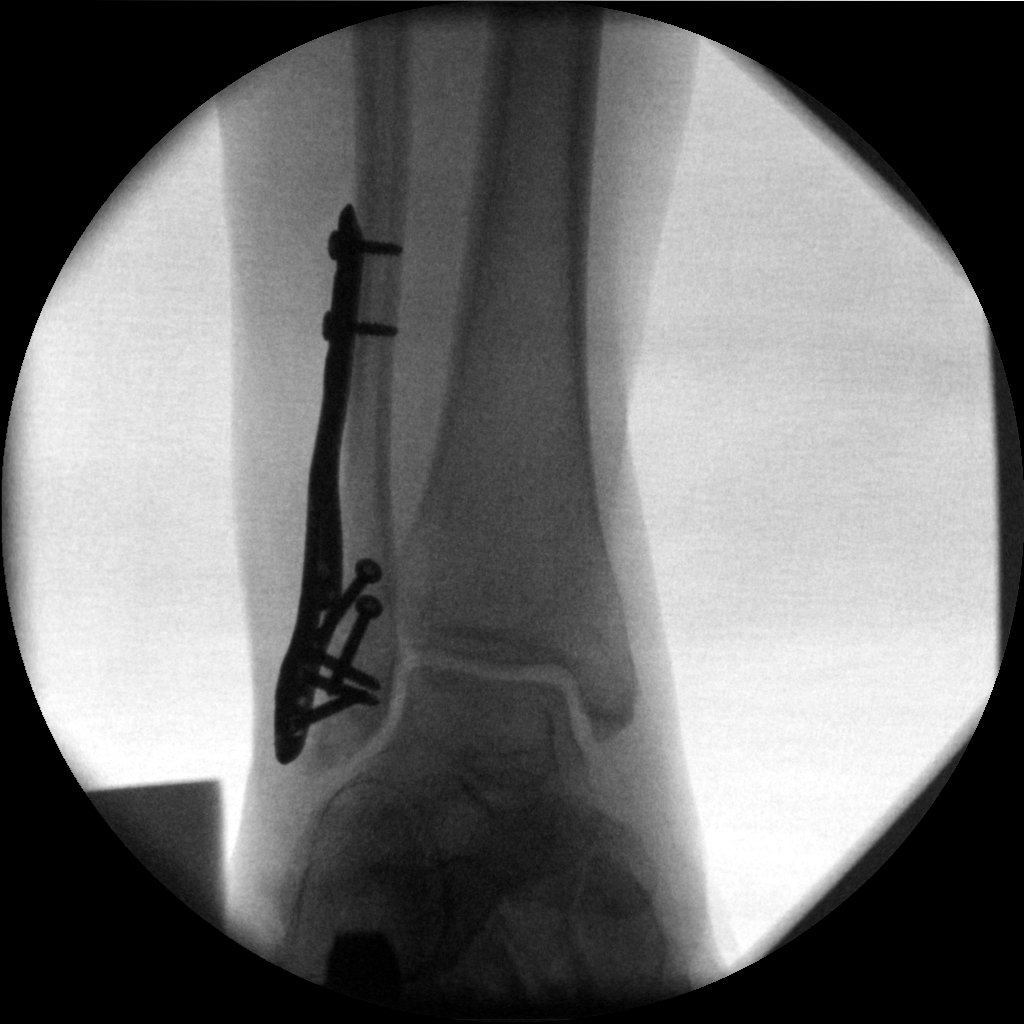

[p2]
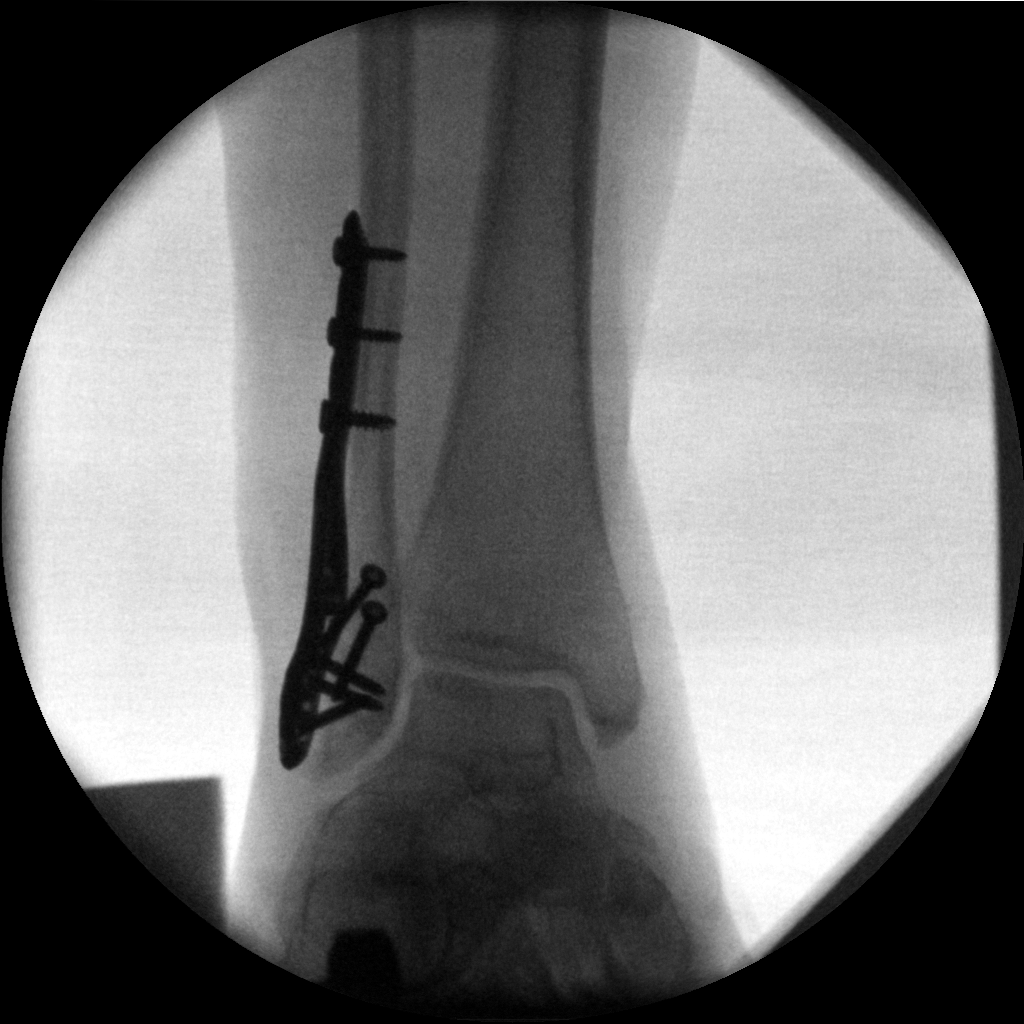

[p3]
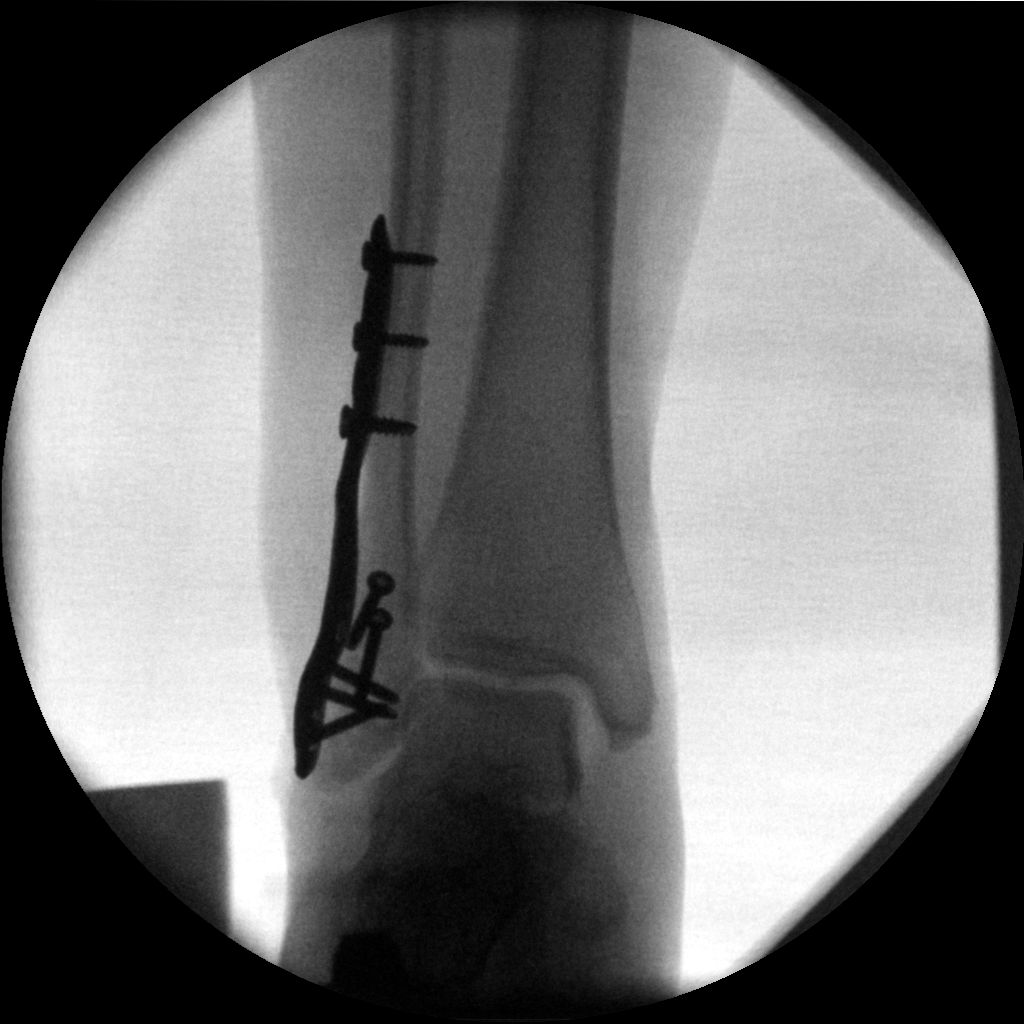

[5 of 5 positions shown; findings below may reference images not displayed]

FINDINGS: Frontal and lateral views were obtained. There is screw and plate
fixation traversing a fracture of the distal fibula with alignment
essentially anatomic. No new fracture. No joint effusion evident.
Ankle mortise appears intact. No appreciable joint space narrowing.
IMPRESSION: Screw and plate fixation in the distal fibula with alignment
anatomic. No new fracture. Ankle mortise appears intact. No
appreciable arthropathic change.

## 2023-01-26 ENCOUNTER — Other Ambulatory Visit: Payer: Self-pay | Admitting: Family Medicine

## 2023-01-26 DIAGNOSIS — Z1231 Encounter for screening mammogram for malignant neoplasm of breast: Secondary | ICD-10-CM

## 2023-03-13 ENCOUNTER — Other Ambulatory Visit: Payer: Self-pay | Admitting: Family Medicine

## 2023-03-13 DIAGNOSIS — Z780 Asymptomatic menopausal state: Secondary | ICD-10-CM

## 2023-03-28 ENCOUNTER — Other Ambulatory Visit: Payer: Self-pay

## 2023-03-28 ENCOUNTER — Emergency Department: Payer: Medicare Other

## 2023-03-28 ENCOUNTER — Inpatient Hospital Stay
Admission: EM | Admit: 2023-03-28 | Discharge: 2023-03-31 | DRG: 281 | Disposition: A | Discharge status: Home / Self Care | Payer: Medicare Other | Attending: Internal Medicine | Admitting: Internal Medicine

## 2023-03-28 DIAGNOSIS — E872 Acidosis, unspecified: Secondary | ICD-10-CM | POA: Diagnosis present

## 2023-03-28 DIAGNOSIS — E669 Obesity, unspecified: Secondary | ICD-10-CM | POA: Diagnosis present

## 2023-03-28 DIAGNOSIS — E039 Hypothyroidism, unspecified: Secondary | ICD-10-CM | POA: Insufficient documentation

## 2023-03-28 DIAGNOSIS — Z91040 Latex allergy status: Secondary | ICD-10-CM | POA: Diagnosis not present

## 2023-03-28 DIAGNOSIS — Z6830 Body mass index (BMI) 30.0-30.9, adult: Secondary | ICD-10-CM | POA: Diagnosis not present

## 2023-03-28 DIAGNOSIS — N1831 Chronic kidney disease, stage 3a: Secondary | ICD-10-CM | POA: Diagnosis present

## 2023-03-28 DIAGNOSIS — E785 Hyperlipidemia, unspecified: Secondary | ICD-10-CM | POA: Diagnosis present

## 2023-03-28 DIAGNOSIS — E1165 Type 2 diabetes mellitus with hyperglycemia: Secondary | ICD-10-CM

## 2023-03-28 DIAGNOSIS — Z888 Allergy status to other drugs, medicaments and biological substances status: Secondary | ICD-10-CM

## 2023-03-28 DIAGNOSIS — I129 Hypertensive chronic kidney disease with stage 1 through stage 4 chronic kidney disease, or unspecified chronic kidney disease: Secondary | ICD-10-CM | POA: Diagnosis present

## 2023-03-28 DIAGNOSIS — Z885 Allergy status to narcotic agent status: Secondary | ICD-10-CM

## 2023-03-28 DIAGNOSIS — E871 Hypo-osmolality and hyponatremia: Secondary | ICD-10-CM | POA: Diagnosis present

## 2023-03-28 DIAGNOSIS — Z79899 Other long term (current) drug therapy: Secondary | ICD-10-CM | POA: Diagnosis not present

## 2023-03-28 DIAGNOSIS — I255 Ischemic cardiomyopathy: Secondary | ICD-10-CM | POA: Diagnosis present

## 2023-03-28 DIAGNOSIS — Z794 Long term (current) use of insulin: Secondary | ICD-10-CM | POA: Diagnosis not present

## 2023-03-28 DIAGNOSIS — I214 Non-ST elevation (NSTEMI) myocardial infarction: Principal | ICD-10-CM | POA: Diagnosis present

## 2023-03-28 DIAGNOSIS — I2510 Atherosclerotic heart disease of native coronary artery without angina pectoris: Secondary | ICD-10-CM | POA: Diagnosis present

## 2023-03-28 DIAGNOSIS — Z8249 Family history of ischemic heart disease and other diseases of the circulatory system: Secondary | ICD-10-CM

## 2023-03-28 DIAGNOSIS — Z7984 Long term (current) use of oral hypoglycemic drugs: Secondary | ICD-10-CM

## 2023-03-28 DIAGNOSIS — E1122 Type 2 diabetes mellitus with diabetic chronic kidney disease: Secondary | ICD-10-CM | POA: Diagnosis present

## 2023-03-28 DIAGNOSIS — Z806 Family history of leukemia: Secondary | ICD-10-CM

## 2023-03-28 DIAGNOSIS — Z91018 Allergy to other foods: Secondary | ICD-10-CM

## 2023-03-28 DIAGNOSIS — Z833 Family history of diabetes mellitus: Secondary | ICD-10-CM

## 2023-03-28 DIAGNOSIS — F17200 Nicotine dependence, unspecified, uncomplicated: Secondary | ICD-10-CM | POA: Diagnosis present

## 2023-03-28 DIAGNOSIS — I441 Atrioventricular block, second degree: Secondary | ICD-10-CM | POA: Diagnosis present

## 2023-03-28 DIAGNOSIS — F17210 Nicotine dependence, cigarettes, uncomplicated: Secondary | ICD-10-CM | POA: Diagnosis present

## 2023-03-28 DIAGNOSIS — F32A Depression, unspecified: Secondary | ICD-10-CM | POA: Diagnosis present

## 2023-03-28 DIAGNOSIS — IMO0001 Reserved for inherently not codable concepts without codable children: Secondary | ICD-10-CM | POA: Diagnosis present

## 2023-03-28 DIAGNOSIS — E1129 Type 2 diabetes mellitus with other diabetic kidney complication: Secondary | ICD-10-CM | POA: Diagnosis present

## 2023-03-28 DIAGNOSIS — I10 Essential (primary) hypertension: Secondary | ICD-10-CM | POA: Diagnosis present

## 2023-03-28 LAB — BASIC METABOLIC PANEL
Anion gap: 14 (ref 5–15)
Anion gap: 11 (ref 5–15)
BUN: 22 mg/dL (ref 8–23)
BUN: 25 mg/dL — ABNORMAL HIGH (ref 8–23)
CO2: 19 mmol/L — ABNORMAL LOW (ref 22–32)
CO2: 23 mmol/L (ref 22–32)
Calcium: 8.2 mg/dL — ABNORMAL LOW (ref 8.9–10.3)
Calcium: 8.4 mg/dL — ABNORMAL LOW (ref 8.9–10.3)
Chloride: 92 mmol/L — ABNORMAL LOW (ref 98–111)
Chloride: 98 mmol/L (ref 98–111)
Creatinine, Ser: 1.26 mg/dL — ABNORMAL HIGH (ref 0.44–1.00)
Creatinine, Ser: 1.31 mg/dL — ABNORMAL HIGH (ref 0.44–1.00)
GFR, Estimated: 47 mL/min — ABNORMAL LOW (ref >60)
GFR, Estimated: 45 mL/min — ABNORMAL LOW (ref >60)
Glucose, Bld: 493 mg/dL — ABNORMAL HIGH (ref 70–99)
Glucose, Bld: 349 mg/dL — ABNORMAL HIGH (ref 70–99)
Potassium: 4 mmol/L (ref 3.5–5.1)
Potassium: 4.4 mmol/L (ref 3.5–5.1)
Sodium: 125 mmol/L — ABNORMAL LOW (ref 135–145)
Sodium: 132 mmol/L — ABNORMAL LOW (ref 135–145)

## 2023-03-28 LAB — PHOSPHORUS: Phosphorus: 2.6 mg/dL (ref 2.5–4.6)

## 2023-03-28 LAB — GLUCOSE, CAPILLARY
Glucose-Capillary: 303 mg/dL — ABNORMAL HIGH (ref 70–99)
Glucose-Capillary: 340 mg/dL — ABNORMAL HIGH (ref 70–99)

## 2023-03-28 LAB — HEPATIC FUNCTION PANEL
ALT: 19 U/L (ref 0–44)
AST: 39 U/L (ref 15–41)
Albumin: 3.8 g/dL (ref 3.5–5.0)
Alkaline Phosphatase: 59 U/L (ref 38–126)
Bilirubin, Direct: 0.2 mg/dL (ref 0.0–0.2)
Indirect Bilirubin: 0.9 mg/dL (ref 0.3–0.9)
Total Bilirubin: 1.1 mg/dL (ref <1.2)
Total Protein: 7.6 g/dL (ref 6.5–8.1)

## 2023-03-28 LAB — TROPONIN I (HIGH SENSITIVITY)
Troponin I (High Sensitivity): 2218 ng/L (ref <18)
Troponin I (High Sensitivity): 5020 ng/L (ref <18)
Troponin I (High Sensitivity): 8693 ng/L (ref <18)
Troponin I (High Sensitivity): 9479 ng/L (ref <18)

## 2023-03-28 LAB — CBC
HCT: 37 % (ref 36.0–46.0)
Hemoglobin: 12.8 g/dL (ref 12.0–15.0)
MCH: 30 pg (ref 26.0–34.0)
MCHC: 34.6 g/dL (ref 30.0–36.0)
MCV: 86.9 fL (ref 80.0–100.0)
Platelets: 255 10*3/uL (ref 150–400)
RBC: 4.26 MIL/uL (ref 3.87–5.11)
RDW: 12.8 % (ref 11.5–15.5)
WBC: 9.9 10*3/uL (ref 4.0–10.5)
nRBC: 0 % (ref 0.0–0.2)

## 2023-03-28 LAB — MAGNESIUM: Magnesium: 1.8 mg/dL (ref 1.7–2.4)

## 2023-03-28 LAB — PROTIME-INR
INR: 1 (ref 0.8–1.2)
Prothrombin Time: 13.8 s (ref 11.4–15.2)

## 2023-03-28 LAB — APTT: aPTT: 20 s — ABNORMAL LOW (ref 24–36)

## 2023-03-28 LAB — CBG MONITORING, ED: Glucose-Capillary: 457 mg/dL — ABNORMAL HIGH (ref 70–99)

## 2023-03-28 LAB — HEPARIN LEVEL (UNFRACTIONATED): Heparin Unfractionated: 0.52 [IU]/mL (ref 0.30–0.70)

## 2023-03-28 MED ORDER — INSULIN ASPART 100 UNIT/ML IJ SOLN: 5.0000 [IU] | Freq: Once | INTRAMUSCULAR | Status: DC

## 2023-03-28 MED ORDER — NITROGLYCERIN 0.4 MG SL SUBL
0.4000 mg | SUBLINGUAL_TABLET | SUBLINGUAL | Status: AC | PRN
  Administered 2023-03-28 (×3): 0.4 mg via SUBLINGUAL
  Filled 2023-03-28 (×3): qty 1

## 2023-03-28 MED ORDER — ASPIRIN 325 MG PO TABS
325.0000 mg | ORAL_TABLET | Freq: Once | ORAL | Status: AC
  Administered 2023-03-28: 325 mg via ORAL
  Filled 2023-03-28: qty 1

## 2023-03-28 MED ORDER — NITROGLYCERIN IN D5W 200-5 MCG/ML-% IV SOLN
0.0000 ug/min | INTRAVENOUS | Status: DC
  Administered 2023-03-28: 5 ug/min via INTRAVENOUS
  Filled 2023-03-28: qty 250

## 2023-03-28 MED ORDER — SODIUM CHLORIDE 1 G PO TABS
1.0000 g | ORAL_TABLET | Freq: Two times a day (BID) | ORAL | Status: DC
  Administered 2023-03-28 – 2023-03-31 (×5): 1 g via ORAL
  Filled 2023-03-28 (×5): qty 1

## 2023-03-28 MED ORDER — INSULIN ASPART 100 UNIT/ML IJ SOLN
8.0000 [IU] | Freq: Once | INTRAMUSCULAR | Status: AC
  Administered 2023-03-28: 8 [IU] via SUBCUTANEOUS
  Filled 2023-03-28: qty 1

## 2023-03-28 MED ORDER — MORPHINE SULFATE (PF) 2 MG/ML IV SOLN
2.0000 mg | INTRAVENOUS | Status: DC | PRN
  Administered 2023-03-29: 2 mg via INTRAVENOUS
  Filled 2023-03-28: qty 1

## 2023-03-28 MED ORDER — ASPIRIN 81 MG PO TBEC
81.0000 mg | DELAYED_RELEASE_TABLET | Freq: Every day | ORAL | Status: DC
  Administered 2023-03-29 – 2023-03-31 (×2): 81 mg via ORAL
  Filled 2023-03-28 (×2): qty 1

## 2023-03-28 MED ORDER — HEPARIN (PORCINE) 25000 UT/250ML-% IV SOLN
1050.0000 [IU]/h | INTRAVENOUS | Status: DC
  Administered 2023-03-28: 750 [IU]/h via INTRAVENOUS
  Administered 2023-03-29: 1050 [IU]/h via INTRAVENOUS
  Filled 2023-03-28 (×2): qty 250

## 2023-03-28 MED ORDER — INSULIN GLARGINE-YFGN 100 UNIT/ML ~~LOC~~ SOLN
10.0000 [IU] | Freq: Every day | SUBCUTANEOUS | Status: DC
  Administered 2023-03-28: 10 [IU] via SUBCUTANEOUS
  Filled 2023-03-28 (×3): qty 0.1

## 2023-03-28 MED ORDER — ESCITALOPRAM OXALATE 10 MG PO TABS
20.0000 mg | ORAL_TABLET | Freq: Every day | ORAL | Status: DC
  Administered 2023-03-28 – 2023-03-31 (×3): 20 mg via ORAL
  Filled 2023-03-28 (×3): qty 2

## 2023-03-28 MED ORDER — ATENOLOL 25 MG PO TABS
25.0000 mg | ORAL_TABLET | Freq: Every day | ORAL | Status: DC
  Administered 2023-03-28 – 2023-03-29 (×2): 25 mg via ORAL
  Filled 2023-03-28 (×3): qty 1

## 2023-03-28 MED ORDER — NICOTINE 21 MG/24HR TD PT24
21.0000 mg | MEDICATED_PATCH | Freq: Every day | TRANSDERMAL | Status: DC
  Filled 2023-03-28 (×2): qty 1

## 2023-03-28 MED ORDER — INSULIN ASPART 100 UNIT/ML IJ SOLN
0.0000 [IU] | Freq: Three times a day (TID) | INTRAMUSCULAR | Status: DC
  Administered 2023-03-29: 7 [IU] via SUBCUTANEOUS
  Administered 2023-03-29: 5 [IU] via SUBCUTANEOUS
  Administered 2023-03-30: 9 [IU] via SUBCUTANEOUS
  Administered 2023-03-31: 7 [IU] via SUBCUTANEOUS
  Administered 2023-03-31: 2 [IU] via SUBCUTANEOUS
  Filled 2023-03-28 (×6): qty 1

## 2023-03-28 MED ORDER — SODIUM CHLORIDE 0.9 % IV SOLN: INTRAVENOUS | Status: AC

## 2023-03-28 MED ORDER — DM-GUAIFENESIN ER 30-600 MG PO TB12: 1.0000 | ORAL_TABLET | Freq: Two times a day (BID) | ORAL | Status: DC | PRN

## 2023-03-28 MED ORDER — ALBUTEROL SULFATE (2.5 MG/3ML) 0.083% IN NEBU: 2.5000 mg | INHALATION_SOLUTION | RESPIRATORY_TRACT | Status: DC | PRN

## 2023-03-28 MED ORDER — SODIUM CHLORIDE 0.9 % IV BOLUS
1000.0000 mL | Freq: Once | INTRAVENOUS | Status: AC
  Administered 2023-03-28: 1000 mL via INTRAVENOUS

## 2023-03-28 MED ORDER — LISINOPRIL-HYDROCHLOROTHIAZIDE 10-12.5 MG PO TABS: 1.0000 | ORAL_TABLET | Freq: Every day | ORAL | Status: DC

## 2023-03-28 MED ORDER — INSULIN ASPART 100 UNIT/ML IJ SOLN
0.0000 [IU] | Freq: Every day | INTRAMUSCULAR | Status: DC
  Administered 2023-03-28: 4 [IU] via SUBCUTANEOUS
  Administered 2023-03-29 – 2023-03-30 (×2): 2 [IU] via SUBCUTANEOUS
  Filled 2023-03-28 (×3): qty 1

## 2023-03-28 MED ORDER — HEPARIN BOLUS VIA INFUSION
3800.0000 [IU] | Freq: Once | INTRAVENOUS | Status: AC
  Administered 2023-03-28: 3800 [IU] via INTRAVENOUS
  Filled 2023-03-28: qty 3800

## 2023-03-28 NOTE — Consult Note (Signed)
Pharmacy Consult Note - Anticoagulation  Pharmacy Consult for heparin Indication: chest pain/ACS  PATIENT MEASUREMENTS: Height: 5' 0.98" (154.9 cm) Weight: 72.6 kg (160 lb) IBW/kg (Calculated) : 47.76 HEPARIN DW (KG): 63.6  VITAL SIGNS: Temp: 98.3 F (36.8 C) (11/13 1507) Temp Source: Oral (11/13 1507) BP: 114/52 (11/13 1700) Pulse Rate: 78 (11/13 1700)  Recent Labs    03/28/23 1508  HGB 12.8  HCT 37.0  PLT 255  CREATININE 1.26*  TROPONINIHS 2,218*    Estimated Creatinine Clearance: 40.5 mL/min (A) (by C-G formula based on SCr of 1.26 mg/dL (H)).  PAST MEDICAL HISTORY: Past Medical History:  Diagnosis Date   Arthritis of hip    Chest pain    a. 11/2011 Lexi MV: No ischemia/infarct, EF 46%.   Complication of anesthesia    nausea/vomiting   Depression    HTN (hypertension)    Hyperlipidemia    Migraines    PONV (postoperative nausea and vomiting)    Tachycardia    Tobacco abuse    Type II or unspecified type diabetes mellitus without mention of complication, not stated as uncontrolled    Unspecified congenital anomaly of eye     ASSESSMENT: 67 y.o. female with PMH including HTN, HLD, DM, smoking is presenting with left center chest pain with vomiting. cTn trending up and ECG concerning for ischemic changes Patient is not on chronic anticoagulation per chart review. Pharmacy has been consulted to initiate and manage heparin intravenous infusion.  Pertinent medications: No chronic AC prior to admission per chart review  Goal(s) of therapy: Heparin level 0.3 - 0.7 units/mL Monitor platelets by anticoagulation protocol: Yes   Baseline anticoagulation labs: Recent Labs    03/28/23 1508  HGB 12.8  PLT 255    Date Time aPTT/HL Rate/Comment    PLAN: Give 3800 units bolus x1; then start heparin infusion at 750 units/hour. Check heparin level in 6 hours, then daily once at least two levels are consecutively therapeutic. Monitor CBC daily while on heparin  infusion.   Will M. Dareen Piano, PharmD Clinical Pharmacist 03/28/2023 5:36 PM

## 2023-03-28 NOTE — ED Provider Notes (Signed)
[facility] Provider Note    Event Date/Time   First MD Initiated Contact with Patient 03/28/23 1605     (approximate)   History   Chest Pain   HPI  Andrea [name] is a 67 year old female with history of T2DM, HTN presenting to the Emergency Department for evaluation of chest pain.  On 10 AM this morning, patient had onset of chest pain described as a tightness in the center of her chest, nonradiating.  Does report nausea with multiple episodes of vomiting.  Denies history of similar.  No fevers or chills.  No abdominal pain.  Received 1 spray of nitroglycerin with EMS with some improvement but not resolution of her pain.     Physical Exam   Triage Vital Signs: ED Triage Vitals  Encounter Vitals Group     BP 03/28/23 1507 132/78     Systolic BP Percentile --      Diastolic BP Percentile --      Pulse Rate 03/28/23 1507 75     Resp 03/28/23 1507 20     Temp 03/28/23 1507 98.3 F (36.8 C)     Temp Source 03/28/23 1507 Oral     SpO2 03/28/23 1507 100 %     Weight 03/28/23 1643 160 lb (72.6 kg)     Height 03/28/23 1643 5' 0.98" (1.549 m)     Head Circumference --      Peak Flow --      Pain Score 03/28/23 1507 7     Pain Loc --      Pain Education --      Exclude from Growth Chart --     Most recent vital signs: Vitals:   03/28/23 2204 03/28/23 2338  BP: (!) 142/81 (!) 98/57  Pulse: 92 83  Resp:  18  Temp:  98.9 F (37.2 C)  SpO2:  97%     General: Awake, interactive  CV:  Regular rate, good peripheral perfusion.  Resp:  Unlabored respirations, lungs clear to auscultation Abd:  Nondistended, soft, nontender Neuro:  Symmetric facial movement, fluid speech   ED Results / Procedures / Treatments   Labs (all labs ordered are listed, but only abnormal results are displayed) Labs Reviewed  BASIC METABOLIC PANEL - Abnormal; Notable for the following components:      Result Value   Sodium 125 (*)    Chloride 92 (*)    CO2 19 (*)     Glucose, Bld 493 (*)    Creatinine, Ser 1.26 (*)    Calcium 8.2 (*)    GFR, Estimated 47 (*)    All other components within normal limits  APTT - Abnormal; Notable for the following components:   aPTT 20 (*)    All other components within normal limits  BASIC METABOLIC PANEL - Abnormal; Notable for the following components:   Sodium 132 (*)    Glucose, Bld 349 (*)    BUN 25 (*)    Creatinine, Ser 1.31 (*)    Calcium 8.4 (*)    GFR, Estimated 45 (*)    All other components within normal limits  GLUCOSE, CAPILLARY - Abnormal; Notable for the following components:   Glucose-Capillary 303 (*)    All other components within normal limits  GLUCOSE, CAPILLARY - Abnormal; Notable for the following components:   Glucose-Capillary 340 (*)    All other components within normal limits  CBG MONITORING, ED - Abnormal; Notable for the following components:   Glucose-Capillary  457 (*)    All other components within normal limits  TROPONIN I (HIGH SENSITIVITY) - Abnormal; Notable for the following components:   Troponin I (High Sensitivity) 2,218 (*)    All other components within normal limits  TROPONIN I (HIGH SENSITIVITY) - Abnormal; Notable for the following components:   Troponin I (High Sensitivity) 5,020 (*)    All other components within normal limits  TROPONIN I (HIGH SENSITIVITY) - Abnormal; Notable for the following components:   Troponin I (High Sensitivity) 4,034 (*)    All other components within normal limits  TROPONIN I (HIGH SENSITIVITY) - Abnormal; Notable for the following components:   Troponin I (High Sensitivity) 9,479 (*)    All other components within normal limits  CBC  HEPATIC FUNCTION PANEL  HEPARIN LEVEL (UNFRACTIONATED)  PROTIME-INR  MAGNESIUM  PHOSPHORUS  CBC  BASIC METABOLIC PANEL  BASIC METABOLIC PANEL  HEMOGLOBIN A1C  LIPID PANEL  HIV ANTIBODY (ROUTINE TESTING W REFLEX)  BASIC METABOLIC PANEL     EKG EKG independently reviewed interpreted by  myself (ER attending) demonstrates:  Initial EKG demonstrates normal sinus rhythm at a rate of 78, PR 126 when QRS 96, QTc 469, some baseline wander noted with nonspecific ST changes Repeat EKG at 1613 redemonstrate sinus rhythm rate of 84, PR 150, QRS 97, QTc 440, remains with nonspecific ST changes  RADIOLOGY Imaging independently reviewed and interpreted by myself demonstrates:  CXR without focal consolidation  PROCEDURES:  Critical Care performed: Yes, see critical care procedure note(s)  CRITICAL CARE Performed by: Trinna Post   Total critical care time: 34 minutes  Critical care time was exclusive of separately billable procedures and treating other patients.  Critical care was necessary to treat or prevent imminent or life-threatening deterioration.  Critical care was time spent personally by me on the following activities: development of treatment plan with patient and/or surrogate as well as nursing, discussions with consultants, evaluation of patient's response to treatment, examination of patient, obtaining history from patient or surrogate, ordering and performing treatments and interventions, ordering and review of laboratory studies, ordering and review of radiographic studies, pulse oximetry and re-evaluation of patient's condition.   Procedures   MEDICATIONS ORDERED IN ED: Medications  heparin ADULT infusion 100 units/mL (25000 units/240mL) (750 Units/hr Intravenous Handoff 03/28/23 2045)  nitroGLYCERIN 50 mg in dextrose 5 % 250 mL (0.2 mg/mL) infusion ( Intravenous Handoff 03/28/23 2044)  aspirin EC tablet 81 mg (has no administration in time range)  atenolol (TENORMIN) tablet 25 mg (25 mg Oral Given 03/28/23 2204)  escitalopram (LEXAPRO) tablet 20 mg (20 mg Oral Given 03/28/23 2204)  0.9 %  sodium chloride infusion ( Intravenous New Bag/Given 03/28/23 2045)  sodium chloride tablet 1 g (1 g Oral Given 03/28/23 2204)  insulin glargine-yfgn (SEMGLEE) injection 10  Units (10 Units Subcutaneous Given 03/28/23 2204)  albuterol (PROVENTIL) (2.5 MG/3ML) 0.083% nebulizer solution 2.5 mg (has no administration in time range)  dextromethorphan-guaiFENesin (MUCINEX DM) 30-600 MG per 12 hr tablet 1 tablet (has no administration in time range)  morphine (PF) 2 MG/ML injection 2 mg (has no administration in time range)  nicotine (NICODERM CQ - dosed in mg/24 hours) patch 21 mg (21 mg Transdermal Patient Refused/Not Given 03/28/23 2205)  insulin aspart (novoLOG) injection 0-5 Units (4 Units Subcutaneous Given 03/28/23 2352)  insulin aspart (novoLOG) injection 0-9 Units (has no administration in time range)  nitroGLYCERIN (NITROSTAT) SL tablet 0.4 mg (has no administration in time range)  nitroGLYCERIN (NITROSTAT) SL tablet 0.4  mg (0.4 mg Sublingual Given 03/28/23 1713)  aspirin tablet 325 mg (325 mg Oral Given 03/28/23 1629)  sodium chloride 0.9 % bolus 1,000 mL (0 mLs Intravenous Stopped 03/28/23 1848)  heparin bolus via infusion 3,800 Units (3,800 Units Intravenous Bolus from Bag 03/28/23 1720)  insulin aspart (novoLOG) injection 8 Units (8 Units Subcutaneous Given 03/28/23 1851)     IMPRESSION / MDM / ASSESSMENT AND PLAN / ED COURSE  I reviewed the triage vital signs and the nursing notes.  Differential diagnosis includes, but is not limited to, ACS, pneumonia, pneumothorax, PE, DKA  Patient's presentation is most consistent with acute presentation with potential threat to life or bodily function.  67 year old female presenting to the Emergency Department for evaluation of chest pain. Initial labs notable for significantly elevated troponin at 2218, repeat increasing to 5000.  Patient does still have some pain here, improving with nitroglycerin.  Will place on nitro drip.  Does additionally have hyperglycemia at 493 but normal anion gap, not consistent with DKA.  Given a liter of fluid.  If remains hyperglycemic following this, will plan for some insulin.  Will  reach out to hospitalist team to discuss admission.  Reviewed with hospitalist team.  They will evaluate the patient for anticipated admission.      FINAL CLINICAL IMPRESSION(S) / ED DIAGNOSES   Final diagnoses:  NSTEMI (non-ST elevated myocardial infarction) (HCC)     Rx / DC Orders   ED Discharge Orders     None        Note:  This document was prepared using Dragon voice recognition software and may include unintentional dictation errors.   Trinna Post, MD 03/29/23 [phone]

## 2023-03-28 NOTE — ED Triage Notes (Signed)
Arrives from home via [facility].  C/O left center pain since 1000.  Vomited several times today.    20 g LAC.  1 spary NTG given.  CBG:  315 (has not taken meds x 3days)  VS wnl.

## 2023-03-28 NOTE — H&P (Signed)
History and Physical    Andrea [name] OZH:[id number] DOB: 06/22/1955 DOA: 03/28/2023  Referring MD/NP/PA:   PCP: Pcp, No   Patient coming from:  The patient is coming from home.     Chief Complaint: Chest pain  HPI: Andrea [name] is a 67 y.o. female with medical history significant of HTN, HL:D, DM, depression, CKD-3a, tobacco abuse, obesity, SVT, migraine headache, panic attacks, who presents with chest pain.   Patient states that her chest pain started about 10 AM.  It is located in substernal area, pressure/tightness feeling, moderate, radiating to left axillary area, not aggravated or alleviated by any known factors.  Has mild SOB, no cough, fever or chills.  Patient reports a lot of burping.  She has nausea and vomited several times with nonbilious nonbloody vomiting.  No diarrhea or abdominal pain.  No symptoms of UTI.  No recent fall or head injury.  No rectal bleeding.  Data reviewed independently and ED Course: pt was found to have troponin 2218 --> 5020, WBC 9.7, slightly worsening renal function, INR 1.0, PTT 20, sodium 125 (which is corrected to 131-134 with blood sugar 493 --> 457), temperature normal, blood pressure 125/67, heart rate 85, RR 21, oxygen saturation 100% on room air.  Patient is admitted to PCU as inpatient.  Dr. Juliann Pares of cardiology is consulted.   EKG: I have personally reviewed.  Sinus rhythm, QTc 440, T wave inversion in lateral leads, ST depression in V5-V6.   Review of Systems:   General: no fevers, chills, no body weight gain, has fatigue HEENT: no blurry vision, hearing changes or sore throat Respiratory: has mild dyspnea, no coughing, wheezing CV: no chest pain, no palpitations GI: has nausea, vomiting, no abdominal pain, diarrhea, constipation GU: no dysuria, burning on urination, increased urinary frequency, hematuria  Ext: no leg edema Neuro: no unilateral weakness, numbness, or tingling, no vision change or hearing loss Skin: no rash, no  skin tear. MSK: No muscle spasm, no deformity, no limitation of range of movement in spin Heme: No easy bruising.  Travel history: No recent long distant travel.   Allergy:  Allergies  Allergen Reactions   Enalapril     Muscle and joint pain   Kiwi Extract     Makes mouth numb   Other     Muscadine fruit makes mouth numb   Percocet [Oxycodone-Acetaminophen] Nausea And Vomiting   Pineapple     Makes mouth numb   Statins Other (See Comments)    Leg cramps   Latex Rash    If wears them     Past Medical History:  Diagnosis Date   Arthritis of hip    Chest pain    a. 11/2011 Lexi MV: No ischemia/infarct, EF 46%.   Complication of anesthesia    nausea/vomiting   Depression    HTN (hypertension)    Hyperlipidemia    Migraines    PONV (postoperative nausea and vomiting)    Tachycardia    Tobacco abuse    Type II or unspecified type diabetes mellitus without mention of complication, not stated as uncontrolled    Unspecified congenital anomaly of eye     Past Surgical History:  Procedure Laterality Date   CARDIAC CATHETERIZATION  Sep 25, 2000   Dr. Gwen Pounds @ [facility] which was negative.   CESAREAN SECTION     x 2   ORIF ANKLE FRACTURE Right 02/12/2017   Procedure: OPEN REDUCTION INTERNAL FIXATION (ORIF) ANKLE FRACTURE;  Surgeon: Signa Kell,  MD;  Location: [facility] ORS;  Service: Orthopedics;  Laterality: Right;   SYNDESMOSIS REPAIR Right 02/12/2017   Procedure: SYNDESMOSIS REPAIR;  Surgeon: Signa Kell, MD;  Location: [facility] ORS;  Service: Orthopedics;  Laterality: Right;   TONSILLECTOMY     TUBAL LIGATION      Social History:  reports that she has been smoking cigarettes. She has a 8.8 pack-year smoking history. She has never used smokeless tobacco. She reports that she does not drink alcohol and does not use drugs.  Family History:  Family History  Problem Relation Age of Onset   Anemia Mother    Diabetes Mother    Leukemia Mother    Heart disease Father     Diabetes Father      Prior to Admission medications   Medication Sig Start Date End Date Taking? Authorizing Provider  lisinopril-hydrochlorothiazide (PRINZIDE,ZESTORETIC) 10-12.5 MG per tablet Take 1 tablet by mouth daily.     [provider]  metFORMIN (GLUCOPHAGE-XR) 500 MG 24 hr tablet Take 500 mg by mouth 2 (two) times daily.    [provider]  Nutritional Supplements (ESTROVEN PO) Take 1 tablet by mouth at bedtime. Supplement for hot flashes    [provider]  ondansetron (ZOFRAN ODT) 4 MG disintegrating tablet Take 1 tablet (4 mg total) by mouth every 8 (eight) hours as needed for nausea or vomiting. 02/12/17   Signa Kell, MD    Physical Exam: Vitals:   03/28/23 2000 03/28/23 2040 03/28/23 2204 03/28/23 2338  BP: 116/66 (!) 142/81 (!) 142/81 (!) 98/57  Pulse: 89 92 92 83  Resp: 15   18  Temp:  98.7 F (37.1 C)  98.9 F (37.2 C)  TempSrc:  Oral    SpO2: 100% 98%  97%  Weight:   71.4 kg   Height:   5' (1.524 m)    General: Not in acute distress HEENT:       Eyes: PERRL, EOMI, no jaundice       ENT: No discharge from the ears and nose, no pharynx injection, no tonsillar enlargement.        Neck: No JVD, no bruit, no mass felt. Heme: No neck lymph node enlargement. Cardiac: S1/S2, RRR, No murmurs, No gallops or rubs. Respiratory: No rales, wheezing, rhonchi or rubs. GI: Soft, nondistended, nontender, no rebound pain, no organomegaly, BS present. GU: No hematuria Ext: No pitting leg edema bilaterally. 1+DP/PT pulse bilaterally. Musculoskeletal: No joint deformities, No joint redness or warmth, no limitation of ROM in spin. Skin: No rashes.  Neuro: Alert, oriented X3, cranial nerves II-XII grossly intact, moves all extremities normally  Psych: Patient is not psychotic, no suicidal or hemocidal ideation.  Labs on Admission: I have personally reviewed following labs and imaging studies  CBC: Recent Labs  Lab 03/28/23 1508  WBC 9.9  HGB  12.8  HCT 37.0  MCV 86.9  PLT 255   Basic Metabolic Panel: Recent Labs  Lab 03/28/23 1508 03/28/23 2042  NA 125* 132*  K 4.0 4.4  CL 92* 98  CO2 19* 23  GLUCOSE 493* 349*  BUN 22 25*  CREATININE 1.26* 1.31*  CALCIUM 8.2* 8.4*  MG  --  1.8  PHOS  --  2.6   GFR: Estimated Creatinine Clearance: 37.8 mL/min (A) (by C-G formula based on SCr of 1.31 mg/dL (H)). Liver Function Tests: Recent Labs  Lab 03/28/23 1508  AST 39  ALT 19  ALKPHOS 59  BILITOT 1.1  PROT 7.6  ALBUMIN 3.8  No results for input(s): "LIPASE", "AMYLASE" in the last 168 hours. No results for input(s): "AMMONIA" in the last 168 hours. Coagulation Profile: Recent Labs  Lab 03/28/23 1642  INR 1.0   Cardiac Enzymes: No results for input(s): "CKTOTAL", "CKMB", "CKMBINDEX", "TROPONINI" in the last 168 hours. BNP (last 3 results) No results for input(s): "PROBNP" in the last 8760 hours. HbA1C: No results for input(s): "HGBA1C" in the last 72 hours. CBG: Recent Labs  Lab 03/28/23 1821 03/28/23 2156 03/28/23 2347  GLUCAP 457* 303* 340*   Lipid Profile: No results for input(s): "CHOL", "HDL", "LDLCALC", "TRIG", "CHOLHDL", "LDLDIRECT" in the last 72 hours. Thyroid Function Tests: No results for input(s): "TSH", "T4TOTAL", "FREET4", "T3FREE", "THYROIDAB" in the last 72 hours. Anemia Panel: No results for input(s): "VITAMINB12", "FOLATE", "FERRITIN", "TIBC", "IRON", "RETICCTPCT" in the last 72 hours. Urine analysis:    Component Value Date/Time   COLORURINE STRAW (A) 01/31/2017 1754   APPEARANCEUR CLEAR (A) 01/31/2017 1754   LABSPEC 1.001 (L) 01/31/2017 1754   PHURINE 5.0 01/31/2017 1754   GLUCOSEU >=500 (A) 01/31/2017 1754   HGBUR NEGATIVE 01/31/2017 1754   BILIRUBINUR NEGATIVE 01/31/2017 1754   KETONESUR NEGATIVE 01/31/2017 1754   PROTEINUR NEGATIVE 01/31/2017 1754   NITRITE NEGATIVE 01/31/2017 1754   LEUKOCYTESUR NEGATIVE 01/31/2017 1754   Sepsis  Labs: @LABRCNTIP (procalcitonin:4,lacticidven:4) )No results found for this or any previous visit (from the past 240 hour(s)).   Radiological Exams on Admission: DG Chest 2 View  Result Date: 03/28/2023 CLINICAL DATA:  Chest pain EXAM: CHEST - 2 VIEW COMPARISON:  04/24/2012 FINDINGS: The heart size and mediastinal contours are within normal limits. Aortic atherosclerosis. Both lungs are clear. The visualized skeletal structures are unremarkable. IMPRESSION: No active cardiopulmonary disease. Electronically Signed   By: Duanne Guess D.O.   On: 03/28/2023 17:36      Assessment/Plan Principal Problem:   NSTEMI (non-ST elevated myocardial infarction) (HCC) Active Problems:   Hyponatremia   Hyperlipidemia   Hypertension   Type II diabetes mellitus with renal manifestations (HCC)   Chronic kidney disease, stage 3a (HCC)   Smoking   Obesity (BMI 30-39.9)   Depression   Assessment and Plan:  NSTEMI (non-ST elevated myocardial infarction) (HCC): trop  2218 --> 5020. Consulted Dr. Juliann Pares of card.  - admit to progressive unit as inpatient - IV heparin - pt is started on nitroglycerin drip in the ED. prn nitroglycerin when patient is off nitroglycerin drip - Trend Trop --prn Morphine, - Aspirin - pt  is allergic to statin.  - Risk factor stratification: will check FLP and A1C   Hyponatremia: sodium 125 (which is corrected to 131-134 with blood sugar 493 --> 457. -will start sodium chloride tablet 1 g twice daily -Hold lisinopril-HCTZ -Fluid restriction  Hyperlipidemia -Follow-up FLP -Patient is allergic to statin.  May need to consider Zetia if LDL is significantly elevated  Hypertension -IV hydralazine as needed -Atenolol -Hold Prinzide due to hyponatremia  Type II diabetes mellitus with renal manifestations [facility]): Blood sugar 493, anion gap normal 14.  Patient is currently not taking her metformin or Jardiance.-Sliding scale insulin -Start glargine insulin 10 units  daily  Chronic kidney disease, stage 3a (HCC): Renal function slightly worsening than baseline.  No recent baseline creatinine available.  Her creatinine was 1.0 on 12/27/2017.  Today creatinine 1.26, BUN 22, GFR 47 -Prinzide is on hold -Follow-up with BMI of  Smoking -Nicotine patch -Counseled about importance of quitting smoking  Obesity (BMI 30-39.9): Body weight 71.4 kg, BMI 30.74 -Encourage  losing weight -Exercise and healthy diet  Depression -Lexapro     DVT ppx: on IV Heparin     Code Status: Full code     Family Communication: not done, no family member is at bed side.      Disposition Plan:  Anticipate discharge back to previous environment  Consults called:  Dr. Juliann Pares of cardiology  Admission status and Level of care: Progressive:  as inpt     Dispo: The patient is from: Home              Anticipated d/c is to: Home              Anticipated d/c date is: 2 days              Patient currently is not medically stable to d/c.    Severity of Illness:  The appropriate patient status for this patient is INPATIENT. Inpatient status is judged to be reasonable and necessary in order to provide the required intensity of service to ensure the patient's safety. The patient's presenting symptoms, physical exam findings, and initial radiographic and laboratory data in the context of their chronic comorbidities is felt to place them at high risk for further clinical deterioration. Furthermore, it is not anticipated that the patient will be medically stable for discharge from the hospital within 2 midnights of admission.   * I certify that at the point of admission it is my clinical judgment that the patient will require inpatient hospital care spanning beyond 2 midnights from the point of admission due to high intensity of service, high risk for further deterioration and high frequency of surveillance required.*       Date of Service 03/29/2023    Lorretta Harp [facility]   If 7PM-7AM, please contact night-coverage [email or web address] 03/29/2023, 12:16 AM

## 2023-03-29 ENCOUNTER — Inpatient Hospital Stay: Admit: 2023-03-29 | Payer: Medicare Other

## 2023-03-29 ENCOUNTER — Other Ambulatory Visit: Payer: Self-pay

## 2023-03-29 ENCOUNTER — Encounter: Payer: Self-pay | Admitting: Internal Medicine

## 2023-03-29 ENCOUNTER — Encounter
Admission: EM | Disposition: A | Discharge status: Home / Self Care | Payer: Self-pay | Source: Home / Self Care | Attending: Internal Medicine

## 2023-03-29 ENCOUNTER — Inpatient Hospital Stay
Admit: 2023-03-29 | Discharge: 2023-03-29 | Disposition: A | Discharge status: Home / Self Care | Payer: Medicare Other | Attending: Student | Admitting: Student

## 2023-03-29 DIAGNOSIS — Z794 Long term (current) use of insulin: Secondary | ICD-10-CM

## 2023-03-29 DIAGNOSIS — E785 Hyperlipidemia, unspecified: Secondary | ICD-10-CM | POA: Diagnosis not present

## 2023-03-29 DIAGNOSIS — E1165 Type 2 diabetes mellitus with hyperglycemia: Secondary | ICD-10-CM

## 2023-03-29 DIAGNOSIS — I214 Non-ST elevation (NSTEMI) myocardial infarction: Secondary | ICD-10-CM | POA: Diagnosis not present

## 2023-03-29 DIAGNOSIS — F32A Depression, unspecified: Secondary | ICD-10-CM | POA: Diagnosis present

## 2023-03-29 DIAGNOSIS — E871 Hypo-osmolality and hyponatremia: Secondary | ICD-10-CM | POA: Diagnosis not present

## 2023-03-29 LAB — BASIC METABOLIC PANEL
Anion gap: 10 (ref 5–15)
Anion gap: 10 (ref 5–15)
BUN: 24 mg/dL — ABNORMAL HIGH (ref 8–23)
BUN: 21 mg/dL (ref 8–23)
CO2: 22 mmol/L (ref 22–32)
CO2: 23 mmol/L (ref 22–32)
Calcium: 8.3 mg/dL — ABNORMAL LOW (ref 8.9–10.3)
Calcium: 8.3 mg/dL — ABNORMAL LOW (ref 8.9–10.3)
Chloride: 101 mmol/L (ref 98–111)
Chloride: 101 mmol/L (ref 98–111)
Creatinine, Ser: 1.3 mg/dL — ABNORMAL HIGH (ref 0.44–1.00)
Creatinine, Ser: 1.12 mg/dL — ABNORMAL HIGH (ref 0.44–1.00)
GFR, Estimated: 46 mL/min — ABNORMAL LOW (ref >60)
GFR, Estimated: 55 mL/min — ABNORMAL LOW (ref >60)
Glucose, Bld: 323 mg/dL — ABNORMAL HIGH (ref 70–99)
Glucose, Bld: 283 mg/dL — ABNORMAL HIGH (ref 70–99)
Potassium: 4.1 mmol/L (ref 3.5–5.1)
Potassium: 3.8 mmol/L (ref 3.5–5.1)
Sodium: 133 mmol/L — ABNORMAL LOW (ref 135–145)
Sodium: 134 mmol/L — ABNORMAL LOW (ref 135–145)

## 2023-03-29 LAB — ECHOCARDIOGRAM COMPLETE
AR max vel: 1.23 cm2
AV Area VTI: 1.28 cm2
AV Area mean vel: 1.13 cm2
AV Mean grad: 4 mm[Hg]
AV Peak grad: 6.6 mm[Hg]
Ao pk vel: 1.28 m/s
Area-P 1/2: 5.5 cm2
Calc EF: 30.8 %
Height: 60 in
MV VTI: 1.26 cm2
S' Lateral: 2.8 cm
Single Plane A2C EF: 27.3 %
Single Plane A4C EF: 35.6 %
Weight: 2518.4 [oz_av]

## 2023-03-29 LAB — CBC
HCT: 33.2 % — ABNORMAL LOW (ref 36.0–46.0)
Hemoglobin: 11.7 g/dL — ABNORMAL LOW (ref 12.0–15.0)
MCH: 29.4 pg (ref 26.0–34.0)
MCHC: 35.2 g/dL (ref 30.0–36.0)
MCV: 83.4 fL (ref 80.0–100.0)
Platelets: 233 10*3/uL (ref 150–400)
RBC: 3.98 MIL/uL (ref 3.87–5.11)
RDW: 12.7 % (ref 11.5–15.5)
WBC: 9.3 10*3/uL (ref 4.0–10.5)
nRBC: 0 % (ref 0.0–0.2)

## 2023-03-29 LAB — LIPID PANEL
Cholesterol: 298 mg/dL — ABNORMAL HIGH (ref 0–200)
HDL: 41 mg/dL (ref >40)
LDL Cholesterol: UNDETERMINED mg/dL (ref 0–99)
Total CHOL/HDL Ratio: 7.3 {ratio}
Triglycerides: 402 mg/dL — ABNORMAL HIGH (ref <150)
VLDL: UNDETERMINED mg/dL (ref 0–40)

## 2023-03-29 LAB — HEPARIN LEVEL (UNFRACTIONATED)
Heparin Unfractionated: 0.15 [IU]/mL — ABNORMAL LOW (ref 0.30–0.70)
Heparin Unfractionated: 0.51 [IU]/mL (ref 0.30–0.70)

## 2023-03-29 LAB — GLUCOSE, CAPILLARY
Glucose-Capillary: 255 mg/dL — ABNORMAL HIGH (ref 70–99)
Glucose-Capillary: 289 mg/dL — ABNORMAL HIGH (ref 70–99)
Glucose-Capillary: 337 mg/dL — ABNORMAL HIGH (ref 70–99)
Glucose-Capillary: 248 mg/dL — ABNORMAL HIGH (ref 70–99)

## 2023-03-29 LAB — TROPONIN I (HIGH SENSITIVITY)
Troponin I (High Sensitivity): 9966 ng/L (ref <18)
Troponin I (High Sensitivity): 15281 ng/L (ref <18)
Troponin I (High Sensitivity): 9472 ng/L (ref <18)

## 2023-03-29 LAB — HEMOGLOBIN A1C
Hgb A1c MFr Bld: 11.1 % — ABNORMAL HIGH (ref 4.8–5.6)
Mean Plasma Glucose: 271.87 mg/dL

## 2023-03-29 LAB — APTT
aPTT: 61 s — ABNORMAL HIGH (ref 24–36)
aPTT: 73 s — ABNORMAL HIGH (ref 24–36)

## 2023-03-29 LAB — HIV ANTIBODY (ROUTINE TESTING W REFLEX): HIV Screen 4th Generation wRfx: NONREACTIVE

## 2023-03-29 LAB — LDL CHOLESTEROL, DIRECT: Direct LDL: 187 mg/dL — ABNORMAL HIGH (ref 0–99)

## 2023-03-29 SURGERY — LEFT HEART CATH AND CORONARY ANGIOGRAPHY
Anesthesia: Moderate Sedation

## 2023-03-29 MED ORDER — INSULIN GLARGINE-YFGN 100 UNIT/ML ~~LOC~~ SOLN
25.0000 [IU] | Freq: Every day | SUBCUTANEOUS | Status: DC
  Administered 2023-03-29 – 2023-03-31 (×3): 25 [IU] via SUBCUTANEOUS
  Filled 2023-03-29 (×3): qty 0.25

## 2023-03-29 MED ORDER — HEPARIN BOLUS VIA INFUSION
950.0000 [IU] | Freq: Once | INTRAVENOUS | Status: AC
  Administered 2023-03-29: 950 [IU] via INTRAVENOUS
  Filled 2023-03-29: qty 950

## 2023-03-29 MED ORDER — HEPARIN BOLUS VIA INFUSION
1900.0000 [IU] | Freq: Once | INTRAVENOUS | Status: AC
  Administered 2023-03-29: 1900 [IU] via INTRAVENOUS
  Filled 2023-03-29: qty 1900

## 2023-03-29 MED ORDER — INSULIN ASPART 100 UNIT/ML IJ SOLN
5.0000 [IU] | Freq: Three times a day (TID) | INTRAMUSCULAR | Status: DC
  Administered 2023-03-29 – 2023-03-31 (×4): 5 [IU] via SUBCUTANEOUS
  Filled 2023-03-29 (×4): qty 1

## 2023-03-29 MED ORDER — EZETIMIBE 10 MG PO TABS
10.0000 mg | ORAL_TABLET | Freq: Every day | ORAL | Status: DC
  Administered 2023-03-29 – 2023-03-31 (×2): 10 mg via ORAL
  Filled 2023-03-29 (×2): qty 1

## 2023-03-29 MED ORDER — NITROGLYCERIN 0.4 MG SL SUBL: 0.4000 mg | SUBLINGUAL_TABLET | SUBLINGUAL | Status: DC | PRN

## 2023-03-29 NOTE — TOC Initial Note (Signed)
Transition of Care [facility]) - Initial/Assessment Note    Patient Details  Name: Andrea [name] MRN: [id number] Date of Birth: [birth year]  Transition of Care [facility]) CM/SW Contact:    Truddie Hidden, RN Phone Number: 03/29/2023, 10:45 AM  Clinical Narrative:                 Patient does not have a PCP. List of providers added to AVS.         Patient Goals and CMS Choice            Expected Discharge Plan and Services                                              Prior Living Arrangements/Services                       Activities of Daily Living   ADL Screening (condition at time of admission) Independently performs ADLs?: Yes (appropriate for developmental age) Is the patient deaf or have difficulty hearing?: Yes Does the patient have difficulty seeing, even when wearing glasses/contacts?: No Does the patient have difficulty concentrating, remembering, or making decisions?: No  Permission Sought/Granted                  Emotional Assessment              Admission diagnosis:  NSTEMI (non-ST elevated myocardial infarction) [facility]) [I21.4] Patient Active Problem List   Diagnosis Date Noted   Depression 03/29/2023   NSTEMI (non-ST elevated myocardial infarction) (HCC) 03/28/2023   Type II diabetes mellitus with renal manifestations (HCC) 03/28/2023   Chronic kidney disease, stage 3a (HCC) 03/28/2023   Obesity (BMI 30-39.9) 03/28/2023   Hyponatremia 03/28/2023   Sinus tachycardia 01/01/2013   Chest pain 11/08/2011   Patient unable to exercise 11/08/2011   Hyperlipidemia 11/08/2011   Hypertension 11/08/2011   Diabetes mellitus (HCC) 11/08/2011   Smoking 11/08/2011   PCP:  Oneita Hurt, No Pharmacy:   [facility] [place] - [place], [place] - [address] [place] [address] Phone: [phone] Fax: [phone]     Social Determinants of Health (SDOH) Social History: SDOH Screenings   Food Insecurity: No Food  Insecurity (03/29/2023)  Housing: Patient Declined (03/29/2023)  Transportation Needs: No Transportation Needs (03/29/2023)  Utilities: Not At Risk (03/29/2023)  Tobacco Use: Medium Risk (03/29/2023)   SDOH Interventions:     Readmission Risk Interventions     No data to display

## 2023-03-29 NOTE — Hospital Course (Addendum)
Andrea [name] is a 67 y.o. female with medical history significant of HTN, HL:D, DM, depression, CKD-3a, tobacco abuse, obesity, SVT, migraine headache, panic attacks, who presents with chest pain.  Troponin peak at 15,281, EKG did not show any ST elevation.  Cardiology consult obtained.  Patient placed on heparin drip, aspirin and beta blocker.  Patient could not tolerate the statin. Chronic angiogram was performed on 11/15, showed severe one-vessel disease with mid LAD.  Decision was made to treat medically. Patient was monitored overnight after heart cath.  Renal function still stable.  Telemetry showed second-degree AV block with infrequent dropping obese.  Discussed with cardiology, Dr. Welton Flakes, she will try to set up a ZIO recorder as outpatient.  Medically stable for discharge.

## 2023-03-29 NOTE — H&P (Incomplete)
History and Physical    Andrea [name] VHQ:[id number] DOB: 07-Feb-1956 DOA: 03/28/2023  Referring MD/NP/PA:   PCP: Pcp, No   Patient coming from:  The patient is coming from home.     Chief Complaint: Chest pain  HPI: Andrea [name] is a 67 y.o. female with medical history significant of HTN, HL:D, DM, depression, CKD-3a, tobacco abuse, obesity, SVT, migraine headache, panic attacks, who presents with chest pain.      Data reviewed independently and ED Course: pt was found to have     ***       EKG: I have personally reviewed.  Not done in ED, will get one.   ***   Review of Systems:   General: no fevers, chills, no body weight gain, has poor appetite, has fatigue HEENT: no blurry vision, hearing changes or sore throat Respiratory: no dyspnea, coughing, wheezing CV: no chest pain, no palpitations GI: no nausea, vomiting, abdominal pain, diarrhea, constipation GU: no dysuria, burning on urination, increased urinary frequency, hematuria  Ext: no leg edema Neuro: no unilateral weakness, numbness, or tingling, no vision change or hearing loss Skin: no rash, no skin tear. MSK: No muscle spasm, no deformity, no limitation of range of movement in spin Heme: No easy bruising.  Travel history: No recent long distant travel.   Allergy:  Allergies  Allergen Reactions  . Enalapril     Muscle and joint pain  . Kiwi Extract     Makes mouth numb  . Other     Muscadine fruit makes mouth numb  . Percocet [Oxycodone-Acetaminophen] Nausea And Vomiting  . Pineapple     Makes mouth numb  . Statins Other (See Comments)    Leg cramps  . Latex Rash    If wears them     Past Medical History:  Diagnosis Date  . Arthritis of hip   . Chest pain    a. 11/2011 Lexi MV: No ischemia/infarct, EF 46%.  . Complication of anesthesia    nausea/vomiting  . Depression   . HTN (hypertension)   . Hyperlipidemia   . Migraines   . PONV (postoperative nausea and vomiting)   . Tachycardia    . Tobacco abuse   . Type II or unspecified type diabetes mellitus without mention of complication, not stated as uncontrolled   . Unspecified congenital anomaly of eye     Past Surgical History:  Procedure Laterality Date  . CARDIAC CATHETERIZATION  Sep 25, 2000   Dr. Gwen Pounds @ [facility] which was negative.  Marland Kitchen CESAREAN SECTION     x 2  . ORIF ANKLE FRACTURE Right 02/12/2017   Procedure: OPEN REDUCTION INTERNAL FIXATION (ORIF) ANKLE FRACTURE;  Surgeon: Signa Kell, MD;  Location: [facility] ORS;  Service: Orthopedics;  Laterality: Right;  . SYNDESMOSIS REPAIR Right 02/12/2017   Procedure: SYNDESMOSIS REPAIR;  Surgeon: Signa Kell, MD;  Location: [facility] ORS;  Service: Orthopedics;  Laterality: Right;  . TONSILLECTOMY    . TUBAL LIGATION      Social History:  reports that she has been smoking cigarettes. She has a 8.8 pack-year smoking history. She has never used smokeless tobacco. She reports that she does not drink alcohol and does not use drugs.  Family History:  Family History  Problem Relation Age of Onset  . Anemia Mother   . Diabetes Mother   . Leukemia Mother   . Heart disease Father   . Diabetes Father      Prior to Admission medications  Medication Sig Start Date End Date Taking? Authorizing Provider  lisinopril-hydrochlorothiazide (PRINZIDE,ZESTORETIC) 10-12.5 MG per tablet Take 1 tablet by mouth daily.     [provider]  metFORMIN (GLUCOPHAGE-XR) 500 MG 24 hr tablet Take 500 mg by mouth 2 (two) times daily.    [provider]  Nutritional Supplements (ESTROVEN PO) Take 1 tablet by mouth at bedtime. Supplement for hot flashes    [provider]  ondansetron (ZOFRAN ODT) 4 MG disintegrating tablet Take 1 tablet (4 mg total) by mouth every 8 (eight) hours as needed for nausea or vomiting. 02/12/17   Signa Kell, MD    Physical Exam: Vitals:   03/28/23 1647 03/28/23 1700 03/28/23 1730 03/28/23 1800  BP:  (!) 114/52 124/71 125/67  Pulse:   78 80 85  Resp:  15 17 (!) 21  Temp:      TempSrc:      SpO2:  100% 100% 100%  Weight: 72.6 kg     Height: 5' 0.98" (1.549 m)      General: Not in acute distress HEENT:       Eyes: PERRL, EOMI, no jaundice       ENT: No discharge from the ears and nose, no pharynx injection, no tonsillar enlargement.        Neck: No JVD, no bruit, no mass felt. Heme: No neck lymph node enlargement. Cardiac: S1/S2, RRR, No murmurs, No gallops or rubs. Respiratory: No rales, wheezing, rhonchi or rubs. GI: Soft, nondistended, nontender, no rebound pain, no organomegaly, BS present. GU: No hematuria Ext: No pitting leg edema bilaterally. 1+DP/PT pulse bilaterally. Musculoskeletal: No joint deformities, No joint redness or warmth, no limitation of ROM in spin. Skin: No rashes.  Neuro: Alert, oriented X3, cranial nerves II-XII grossly intact, moves all extremities normally. Muscle strength 5/5 in all extremities, sensation to light touch intact. Brachial reflex 2+ bilaterally. Knee reflex 1+ bilaterally. Negative Babinski's sign. Normal finger to nose test. Psych: Patient is not psychotic, no suicidal or hemocidal ideation.  Labs on Admission: I have personally reviewed following labs and imaging studies  CBC: Recent Labs  Lab 03/28/23 1508  WBC 9.9  HGB 12.8  HCT 37.0  MCV 86.9  PLT 255   Basic Metabolic Panel: Recent Labs  Lab 03/28/23 1508  NA 125*  K 4.0  CL 92*  CO2 19*  GLUCOSE 493*  BUN 22  CREATININE 1.26*  CALCIUM 8.2*   GFR: Estimated Creatinine Clearance: 40.5 mL/min (A) (by C-G formula based on SCr of 1.26 mg/dL (H)). Liver Function Tests: Recent Labs  Lab 03/28/23 1508  AST 39  ALT 19  ALKPHOS 59  BILITOT 1.1  PROT 7.6  ALBUMIN 3.8   No results for input(s): "LIPASE", "AMYLASE" in the last 168 hours. No results for input(s): "AMMONIA" in the last 168 hours. Coagulation Profile: Recent Labs  Lab 03/28/23 1642  INR 1.0   Cardiac Enzymes: No results for  input(s): "CKTOTAL", "CKMB", "CKMBINDEX", "TROPONINI" in the last 168 hours. BNP (last 3 results) No results for input(s): "PROBNP" in the last 8760 hours. HbA1C: No results for input(s): "HGBA1C" in the last 72 hours. CBG: Recent Labs  Lab 03/28/23 1821  GLUCAP 457*   Lipid Profile: No results for input(s): "CHOL", "HDL", "LDLCALC", "TRIG", "CHOLHDL", "LDLDIRECT" in the last 72 hours. Thyroid Function Tests: No results for input(s): "TSH", "T4TOTAL", "FREET4", "T3FREE", "THYROIDAB" in the last 72 hours. Anemia Panel: No results for input(s): "VITAMINB12", "FOLATE", "FERRITIN", "TIBC", "IRON", "RETICCTPCT" in the last  72 hours. Urine analysis:    Component Value Date/Time   COLORURINE STRAW (A) 01/31/2017 1754   APPEARANCEUR CLEAR (A) 01/31/2017 1754   LABSPEC 1.001 (L) 01/31/2017 1754   PHURINE 5.0 01/31/2017 1754   GLUCOSEU >=500 (A) 01/31/2017 1754   HGBUR NEGATIVE 01/31/2017 1754   BILIRUBINUR NEGATIVE 01/31/2017 1754   KETONESUR NEGATIVE 01/31/2017 1754   PROTEINUR NEGATIVE 01/31/2017 1754   NITRITE NEGATIVE 01/31/2017 1754   LEUKOCYTESUR NEGATIVE 01/31/2017 1754   Sepsis Labs: @LABRCNTIP (procalcitonin:4,lacticidven:4) )No results found for this or any previous visit (from the past 240 hour(s)).   Radiological Exams on Admission: DG Chest 2 View  Result Date: 03/28/2023 CLINICAL DATA:  Chest pain EXAM: CHEST - 2 VIEW COMPARISON:  04/24/2012 FINDINGS: The heart size and mediastinal contours are within normal limits. Aortic atherosclerosis. Both lungs are clear. The visualized skeletal structures are unremarkable. IMPRESSION: No active cardiopulmonary disease. Electronically Signed   By: Duanne Guess D.O.   On: 03/28/2023 17:36      Assessment/Plan Active Problems:   * No active hospital problems. *   Assessment and Plan: No notes have been filed under this hospital service. Service: Hospitalist      Active Problems:   * No active hospital problems.  *    DVT ppx: SQ Heparin         SQ Lovenox  Code Status: Full code   ***  Family Communication: not done, no family member is at bed side.              Yes, patient's    at bed side.       by phone  Disposition Plan:  Anticipate discharge back to previous environment  Consults called:    Admission status and Level of care: :    Med-surg bed for obs as inpt    progressive unit for obs   as inpt      SDU/inpation        {Inpatient:23812}  Dispo: The patient is from: {From:23814}              Anticipated d/c is to: {To:23815}              Anticipated d/c date is: {Days:23816}              Patient currently {Medically stable:23817}    Severity of Illness:  {Observation/Inpatient:21159}       Date of Service 03/28/2023    Lorretta Harp [facility]   If 7PM-7AM, please contact night-coverage [email or web address] 03/28/2023, 6:35 PM

## 2023-03-29 NOTE — Consult Note (Signed)
[facility] CLINIC CARDIOLOGY CONSULT NOTE       Patient ID: Andrea [name] MRN: [id number] DOB/AGE: [birth year] 67 y.o.  Admit date: 03/28/2023 Referring Physician Dr. Lorretta Harp Primary Physician Pcp, No  Primary Cardiologist Dr. Darrold Junker (last seen 2016) Reason for Consultation chest pain, NSTEMI  HPI: Andrea [name] is a 67 y.o. female  with a past medical history of SVT, hypertension, hyperlipidemia, type II diabetes, chronic kidney disease stage III, tobacco use, obesity who presented to the ED on 03/28/2023 for chest pain. Cardiology was consulted for further evaluation.   Patient reports that around 10 AM yesterday morning she began experiencing chest pain.  She describes this as tightness but also had sharp sensations as well.  States that she has had intermittent episodes in the past few weeks, however the episode yesterday was more severe prompting her to come to the ED for further evaluation.  Workup in the ED notable for creatinine 1.26, potassium 4.0, sodium 125, hemoglobin 12.8, WBC 9.9.  Troponins found to be elevated and trended 2218 > 5020 > 8693 > 9479 > 9966 > 15281 > 9472. EKG with NSR, nonacute.  Chest x-ray without acute abnormality.  At time of my evaluation this morning patient is resting comfortably in hospital bed.  States that she still has chest pain but overall this is improved than when she first came in.  Also endorses associated shortness of breath.  Denies any palpitations, dizziness.  Discussed recent episodes as well as episode that brought her to the ED.  She reports that she had a heart catheterization done in the past via her femoral artery and she had significant bruising from this so she is hesitant to have another cath done.  Review of systems complete and found to be negative unless listed above    Past Medical History:  Diagnosis Date   Arthritis of hip    Chest pain    a. 11/2011 Lexi MV: No ischemia/infarct, EF 46%.   Complication of anesthesia     nausea/vomiting   Depression    HTN (hypertension)    Hyperlipidemia    Migraines    PONV (postoperative nausea and vomiting)    Tachycardia    Tobacco abuse    Type II or unspecified type diabetes mellitus without mention of complication, not stated as uncontrolled    Unspecified congenital anomaly of eye     Past Surgical History:  Procedure Laterality Date   CARDIAC CATHETERIZATION  Sep 25, 2000   Dr. Gwen Pounds @ [facility] which was negative.   CESAREAN SECTION     x 2   ORIF ANKLE FRACTURE Right 02/12/2017   Procedure: OPEN REDUCTION INTERNAL FIXATION (ORIF) ANKLE FRACTURE;  Surgeon: Signa Kell, MD;  Location: [facility] ORS;  Service: Orthopedics;  Laterality: Right;   SYNDESMOSIS REPAIR Right 02/12/2017   Procedure: SYNDESMOSIS REPAIR;  Surgeon: Signa Kell, MD;  Location: [facility] ORS;  Service: Orthopedics;  Laterality: Right;   TONSILLECTOMY     TUBAL LIGATION      Medications Prior to Admission  Medication Sig Dispense Refill Last Dose   aspirin EC 81 MG tablet Take 81 mg by mouth daily. Swallow whole.   03/27/2023   atenolol (TENORMIN) 25 MG tablet Take 25 mg by mouth daily.   03/27/2023   escitalopram (LEXAPRO) 20 MG tablet Take 20 mg by mouth daily.   03/27/2023   lisinopril-hydrochlorothiazide (PRINZIDE,ZESTORETIC) 10-12.5 MG per tablet Take 1 tablet by mouth daily.    03/27/2023 at NIGHT  JARDIANCE 10 MG TABS tablet Take 10 mg by mouth daily.      metFORMIN (GLUCOPHAGE-XR) 500 MG 24 hr tablet Take 500 mg by mouth 2 (two) times daily. (Patient not taking: Reported on 03/28/2023)   Not Taking   Nutritional Supplements (ESTROVEN PO) Take 1 tablet by mouth at bedtime. Supplement for hot flashes      ondansetron (ZOFRAN ODT) 4 MG disintegrating tablet Take 1 tablet (4 mg total) by mouth every 8 (eight) hours as needed for nausea or vomiting. (Patient not taking: Reported on 03/28/2023) 20 tablet 0 Not Taking   Social History   Socioeconomic History   Marital status:  Married    Spouse name: Not on file   Number of children: Not on file   Years of education: Not on file   Highest education level: Not on file  Occupational History   Not on file  Tobacco Use   Smoking status: Former    Current packs/day: 0.00    Average packs/day: 0.3 packs/day for 35.0 years (8.8 ttl pk-yrs)    Types: Cigarettes    Quit date: 05/15/2021    Years since quitting: 1.8   Smokeless tobacco: Never  Vaping Use   Vaping status: Former  Substance and Sexual Activity   Alcohol use: No   Drug use: No   Sexual activity: Not on file  Other Topics Concern   Not on file  Social History Narrative   Not on file   Social Determinants of Health   Financial Resource Strain: Not on file  Food Insecurity: No Food Insecurity (03/29/2023)   Hunger Vital Sign    Worried About Running Out of Food in the Last Year: Never true    Ran Out of Food in the Last Year: Never true  Transportation Needs: No Transportation Needs (03/29/2023)   PRAPARE - Administrator, Civil Service (Medical): No    Lack of Transportation (Non-Medical): No  Physical Activity: Not on file  Stress: Not on file  Social Connections: Not on file  Intimate Partner Violence: Not At Risk (03/29/2023)   Humiliation, Afraid, Rape, and Kick questionnaire    Fear of Current or Ex-Partner: No    Emotionally Abused: No    Physically Abused: No    Sexually Abused: No    Family History  Problem Relation Age of Onset   Anemia Mother    Diabetes Mother    Leukemia Mother    Heart disease Father    Diabetes Father      Vitals:   03/29/23 0757 03/29/23 1124 03/29/23 1125 03/29/23 1540  BP: 134/84 136/71  (!) 107/53  Pulse: (!) 102 100 85 90  Resp: 20 20  20   Temp: 98.9 F (37.2 C) 98.6 F (37 C)  98.4 F (36.9 C)  TempSrc: Oral Oral  Oral  SpO2: (!) 82% (!) 82% 98% 98%  Weight:      Height:        PHYSICAL EXAM General: Well appearing female, well nourished, in no acute distress resting  comfortably in hospital bed. HEENT: Normocephalic and atraumatic. Neck: No JVD.  Lungs: Normal respiratory effort on room air. Clear bilaterally to auscultation. No wheezes, crackles, rhonchi.  Heart: HRRR. Normal S1 and S2 without gallops or murmurs.  Abdomen: Non-distended appearing.  Msk: Normal strength and tone for age. Extremities: Warm and well perfused. No clubbing, cyanosis. No edema.  Neuro: Alert and oriented X 3. Psych: Answers questions appropriately.   Labs: Basic Metabolic  Panel: Recent Labs    03/28/23 2042 03/29/23 0154 03/29/23 0738  NA 132* 133* 134*  K 4.4 4.1 3.8  CL 98 101 101  CO2 23 22 23   GLUCOSE 349* 323* 283*  BUN 25* 24* 21  CREATININE 1.31* 1.30* 1.12*  CALCIUM 8.4* 8.3* 8.3*  MG 1.8  --   --   PHOS 2.6  --   --    Liver Function Tests: Recent Labs    03/28/23 1508  AST 39  ALT 19  ALKPHOS 59  BILITOT 1.1  PROT 7.6  ALBUMIN 3.8   No results for input(s): "LIPASE", "AMYLASE" in the last 72 hours. CBC: Recent Labs    03/28/23 1508 03/29/23 0154  WBC 9.9 9.3  HGB 12.8 11.7*  HCT 37.0 33.2*  MCV 86.9 83.4  PLT 255 233   Cardiac Enzymes: Recent Labs    03/29/23 0154 03/29/23 0506 03/29/23 1355  TROPONINIHS 9,966* 15,281* 9,472*   BNP: No results for input(s): "BNP" in the last 72 hours. D-Dimer: No results for input(s): "DDIMER" in the last 72 hours. Hemoglobin A1C: Recent Labs    03/28/23 2042  HGBA1C 11.1*   Fasting Lipid Panel: Recent Labs    03/29/23 0154  CHOL 298*  HDL 41  LDLCALC UNABLE TO CALCULATE IF TRIGLYCERIDE OVER 400 mg/dL  TRIG 454*  CHOLHDL 7.3  LDLDIRECT 187*   Thyroid Function Tests: No results for input(s): "TSH", "T4TOTAL", "T3FREE", "THYROIDAB" in the last 72 hours.  Invalid input(s): "FREET3" Anemia Panel: No results for input(s): "VITAMINB12", "FOLATE", "FERRITIN", "TIBC", "IRON", "RETICCTPCT" in the last 72 hours.   Radiology: ECHOCARDIOGRAM COMPLETE  Result Date: 03/29/2023     ECHOCARDIOGRAM REPORT   Patient Name:   Andrea [name] Date of Exam: 03/29/2023 Medical Rec #:  [id number]      Height:       60.0 in Accession #:    [id number]     Weight:       157.4 lb Date of Birth:  November 18, 1955      BSA:          1.686 m Patient Age:    67 years       BP:           136/71 mmHg Patient Gender: F              HR:           100 bpm. Exam Location:  [facility] Procedure: 2D Echo, Cardiac Doppler and Color Doppler Indications:     NSTEMI I21.4  History:         Patient has no prior history of Echocardiogram examinations.                  Risk Factors:Hypertension and Dyslipidemia. Tobacco use.  Sonographer:     Cristela Blue Referring Phys:  [id number] Yosef Krogh Diagnosing Phys: Mellody Drown Alluri IMPRESSIONS  1. Left ventricular ejection fraction, by estimation, is 30 to 35%. The left ventricle has severely decreased function. The left ventricle demonstrates regional wall motion abnormalities (see scoring diagram/findings for description). Left ventricular diastolic parameters are consistent with Grade II diastolic dysfunction (pseudonormalization).  2. Right ventricular systolic function is normal. The right ventricular size is normal.  3. Moderate mitral valve regurgitation.  4. The aortic valve is normal in structure. Aortic valve regurgitation is not visualized. No aortic stenosis is present.  5. The inferior vena cava is normal in size with greater than 50% respiratory variability, suggesting right atrial  pressure of 3 mmHg. FINDINGS  Left Ventricle: Left ventricular ejection fraction, by estimation, is 30 to 35%. The left ventricle has severely decreased function. The left ventricle demonstrates regional wall motion abnormalities. The left ventricular internal cavity size was normal  in size. There is no left ventricular hypertrophy. Left ventricular diastolic parameters are consistent with Grade II diastolic dysfunction (pseudonormalization).  LV Wall Scoring: The entire apex is akinetic. The mid  anteroseptal segment, mid inferolateral segment, mid anterolateral segment, mid inferoseptal segment, mid anterior segment, and mid inferior segment are hypokinetic. The basal anteroseptal segment, basal inferolateral segment, basal anterolateral segment, basal anterior segment, basal inferior segment, and basal inferoseptal segment are normal. Right Ventricle: The right ventricular size is normal. No increase in right ventricular wall thickness. Right ventricular systolic function is normal. Left Atrium: Left atrial size was normal in size. Right Atrium: Right atrial size was normal in size. Pericardium: There is no evidence of pericardial effusion. Mitral Valve: There is mild thickening of the mitral valve leaflet(s). Moderate mitral valve regurgitation. MV peak gradient, 8.3 mmHg. The mean mitral valve gradient is 3.0 mmHg. Tricuspid Valve: The tricuspid valve is not well visualized. Tricuspid valve regurgitation is trivial. Aortic Valve: The aortic valve is normal in structure. Aortic valve regurgitation is not visualized. No aortic stenosis is present. Aortic valve mean gradient measures 4.0 mmHg. Aortic valve peak gradient measures 6.6 mmHg. Aortic valve area, by VTI measures 1.28 cm. Pulmonic Valve: The pulmonic valve was not well visualized. Pulmonic valve regurgitation is trivial. Aorta: The aortic root is normal in size and structure. Venous: The inferior vena cava is normal in size with greater than 50% respiratory variability, suggesting right atrial pressure of 3 mmHg. IAS/Shunts: The interatrial septum was not assessed.  LEFT VENTRICLE PLAX 2D LVIDd:         3.60 cm     Diastology LVIDs:         2.80 cm     LV e' medial:    6.31 cm/s LV PW:         0.80 cm     LV E/e' medial:  14.9 LV IVS:        1.20 cm     LV e' lateral:   5.77 cm/s LVOT diam:     2.00 cm     LV E/e' lateral: 16.3 LV SV:         32 LV SV Index:   19 LVOT Area:     3.14 cm  LV Volumes (MOD) LV vol d, MOD A2C: 56.4 ml LV vol d, MOD  A4C: 82.9 ml LV vol s, MOD A2C: 41.0 ml LV vol s, MOD A4C: 53.4 ml LV SV MOD A2C:     15.4 ml LV SV MOD A4C:     82.9 ml LV SV MOD BP:      22.1 ml RIGHT VENTRICLE RV S prime:     13.60 cm/s TAPSE (M-mode): 2.0 cm LEFT ATRIUM             Index        RIGHT ATRIUM           Index LA diam:        3.00 cm 1.78 cm/m   RA Area:     10.50 cm LA Vol (A2C):   9.6 ml  5.72 ml/m   RA Volume:   25.00 ml  14.83 ml/m LA Vol (A4C):   21.4 ml 12.69 ml/m LA Biplane Vol: 16.0 ml  9.49 ml/m  AORTIC VALVE AV Area (Vmax):    1.23 cm AV Area (Vmean):   1.13 cm AV Area (VTI):     1.28 cm AV Vmax:           128.33 cm/s AV Vmean:          90.600 cm/s AV VTI:            0.253 m AV Peak Grad:      6.6 mmHg AV Mean Grad:      4.0 mmHg LVOT Vmax:         50.30 cm/s LVOT Vmean:        32.700 cm/s LVOT VTI:          0.103 m LVOT/AV VTI ratio: 0.41  AORTA Ao Root diam: 2.00 cm MITRAL VALVE                TRICUSPID VALVE MV Area (PHT): 5.50 cm     TR Peak grad:   33.4 mmHg MV Area VTI:   1.26 cm     TR Vmax:        289.00 cm/s MV Peak grad:  8.3 mmHg MV Mean grad:  3.0 mmHg     SHUNTS MV Vmax:       1.44 m/s     Systemic VTI:  0.10 m MV Vmean:      84.4 cm/s    Systemic Diam: 2.00 cm MV Decel Time: 138 msec MV E velocity: 94.30 cm/s MV A velocity: 112.00 cm/s MV E/A ratio:  0.84 Windell Norfolk Electronically signed by Windell Norfolk Signature Date/Time: 03/29/2023/2:58:21 PM    Final    DG Chest 2 View  Result Date: 03/28/2023 CLINICAL DATA:  Chest pain EXAM: CHEST - 2 VIEW COMPARISON:  04/24/2012 FINDINGS: The heart size and mediastinal contours are within normal limits. Aortic atherosclerosis. Both lungs are clear. The visualized skeletal structures are unremarkable. IMPRESSION: No active cardiopulmonary disease. Electronically Signed   By: Duanne Guess D.O.   On: 03/28/2023 17:36    ECHO pending  TELEMETRY reviewed by me 03/29/2023: sinus rhythm rate 80s  EKG reviewed by me: sinus rhythm rate 76 bpm, nonacute  Data  reviewed by me 03/29/2023: last 24h vitals tele labs imaging I/O ED provider note, admission H&P  Principal Problem:   NSTEMI (non-ST elevated myocardial infarction) (HCC) Active Problems:   Hyperlipidemia   Hypertension   Smoking   Type II diabetes mellitus with renal manifestations (HCC)   Chronic kidney disease, stage 3a (HCC)   Obesity (BMI 30-39.9)   Hyponatremia   Depression   Uncontrolled type 2 diabetes mellitus with hyperglycemia, with long-term current use of insulin (HCC)    ASSESSMENT AND PLAN:  Evyn D [name] is a 68 y.o. female  with a past medical history of SVT, hypertension, hyperlipidemia, type II diabetes, chronic kidney disease stage III, tobacco use, obesity who presented to the ED on 03/28/2023 for chest pain. Cardiology was consulted for further evaluation.   # NSTEMI # Hypertension # Hyperlipidemia Patient presented with CP described as tightness beginning yesterday morning and persisting. Reports recent similar episodes. Troponins 2218 > 5020 > 8693 > 9479 > 9966 > 15281 > 9472. EKG with NSR, no acute ST-T changes.  -S/p aspirin 325 mg in the ED. Continue aspirin 81 mg daily.  -Continue heparin infusion. -Continue atenolol 25 mg daily.  -Continue nitroglycerin infusion.  -Plan for [facility] tomorrow with Dr. Kirke Corin. Risks and benefits discussed in detail with patient. She  is amenable to proceed. Written consent to be obtained.  # Chronic kidney disease stage IIIa Cr ~1.3 since admission, no recent labs for comparison.  -Continue to monitor renal function closely.    This patient's plan of care was discussed and created with Dr. Corky Sing and he is in agreement.  Signed: Gale Journey, PA-C  03/29/2023, 3:49 PM [facility]

## 2023-03-29 NOTE — Consult Note (Addendum)
Pharmacy Consult Note - Anticoagulation  Pharmacy Consult for heparin Indication: chest pain/ACS  PATIENT MEASUREMENTS: Height: 5' (152.4 cm) Weight: 71.4 kg (157 lb 6.4 oz) IBW/kg (Calculated) : 45.5 HEPARIN DW (KG): 63.6  VITAL SIGNS: Temp: 98.6 F (37 C) (11/14 1124) Temp Source: Oral (11/14 1124) BP: 136/71 (11/14 1124) Pulse Rate: 85 (11/14 1125)  Recent Labs    03/28/23 1642 03/28/23 2042 03/29/23 0154 03/29/23 0506 03/29/23 0738 03/29/23 1130  HGB  --   --  11.7*  --   --   --   HCT  --   --  33.2*  --   --   --   PLT  --   --  233  --   --   --   APTT 20*  --   --   --   --  61*  LABPROT 13.8  --   --   --   --   --   INR 1.0  --   --   --   --   --   HEPARINUNFRC  --    < > 0.15*  --   --  0.51  CREATININE  --    < > 1.30*  --  1.12*  --   TROPONINIHS 5,020*   < > 9,966* 15,281*  --   --    < > = values in this interval not displayed.    Estimated Creatinine Clearance: 44.2 mL/min (A) (by C-G formula based on SCr of 1.12 mg/dL (H)).  PAST MEDICAL HISTORY: Past Medical History:  Diagnosis Date   Arthritis of hip    Chest pain    a. 11/2011 Lexi MV: No ischemia/infarct, EF 46%.   Complication of anesthesia    nausea/vomiting   Depression    HTN (hypertension)    Hyperlipidemia    Migraines    PONV (postoperative nausea and vomiting)    Tachycardia    Tobacco abuse    Type II or unspecified type diabetes mellitus without mention of complication, not stated as uncontrolled    Unspecified congenital anomaly of eye     ASSESSMENT: 67 y.o. female with PMH including HTN, HLD, DM, smoking is presenting with left center chest pain with vomiting. cTn trending up and ECG concerning for ischemic changes Patient is not on chronic anticoagulation per chart review. Pharmacy has been consulted to initiate and manage heparin intravenous infusion.  Pertinent medications: No chronic AC prior to admission per chart review  Goal(s) of therapy: Heparin level 0.3 -  0.7 units/mL Monitor platelets by anticoagulation protocol: Yes   Baseline anticoagulation labs: Recent Labs    03/28/23 1508 03/28/23 1642 03/29/23 0154 03/29/23 1130  APTT  --  20*  --  61*  INR  --  1.0  --   --   HGB 12.8  --  11.7*  --   PLT 255  --  233  --      Date Time aPTT/HL  Rate/Comment 11/14 0154 HL 0.15  Subtherapeutic 11/14 1130 aPTT 61/0.51 HL Subtherapeutic     PLAN: Give heparin bolus 950 units Increase heparin infusion to 1050 units/hour from 950 units/hour Recheck aPTT level in 6 hours after rate change Patient has TG > 400; manage heparin by aPTT  Monitor CBC daily while on heparin infusion.  Effie Shy, PharmD Pharmacy Resident  03/29/2023 12:42 PM

## 2023-03-29 NOTE — H&P (View-Only) (Signed)
 [facility] CLINIC CARDIOLOGY CONSULT NOTE       Patient ID: Andrea [name] MRN: [id number] DOB/AGE: [birth year] 67 y.o.  Admit date: 03/28/2023 Referring Physician Dr. Lorretta Harp Primary Physician Pcp, No  Primary Cardiologist Dr. Darrold Junker (last seen 2016) Reason for Consultation chest pain, NSTEMI  HPI: Andrea [name] is a 67 y.o. female  with a past medical history of SVT, hypertension, hyperlipidemia, type II diabetes, chronic kidney disease stage III, tobacco use, obesity who presented to the ED on 03/28/2023 for chest pain. Cardiology was consulted for further evaluation.   Patient reports that around 10 AM yesterday morning she began experiencing chest pain.  She describes this as tightness but also had sharp sensations as well.  States that she has had intermittent episodes in the past few weeks, however the episode yesterday was more severe prompting her to come to the ED for further evaluation.  Workup in the ED notable for creatinine 1.26, potassium 4.0, sodium 125, hemoglobin 12.8, WBC 9.9.  Troponins found to be elevated and trended 2218 > 5020 > 8693 > 9479 > 9966 > 15281 > 9472. EKG with NSR, nonacute.  Chest x-ray without acute abnormality.  At time of my evaluation this morning patient is resting comfortably in hospital bed.  States that she still has chest pain but overall this is improved than when she first came in.  Also endorses associated shortness of breath.  Denies any palpitations, dizziness.  Discussed recent episodes as well as episode that brought her to the ED.  She reports that she had a heart catheterization done in the past via her femoral artery and she had significant bruising from this so she is hesitant to have another cath done.  Review of systems complete and found to be negative unless listed above    Past Medical History:  Diagnosis Date   Arthritis of hip    Chest pain    a. 11/2011 Lexi MV: No ischemia/infarct, EF 46%.   Complication of anesthesia     nausea/vomiting   Depression    HTN (hypertension)    Hyperlipidemia    Migraines    PONV (postoperative nausea and vomiting)    Tachycardia    Tobacco abuse    Type II or unspecified type diabetes mellitus without mention of complication, not stated as uncontrolled    Unspecified congenital anomaly of eye     Past Surgical History:  Procedure Laterality Date   CARDIAC CATHETERIZATION  Sep 25, 2000   Dr. Gwen Pounds @ [facility] which was negative.   CESAREAN SECTION     x 2   ORIF ANKLE FRACTURE Right 02/12/2017   Procedure: OPEN REDUCTION INTERNAL FIXATION (ORIF) ANKLE FRACTURE;  Surgeon: Signa Kell, MD;  Location: [facility] ORS;  Service: Orthopedics;  Laterality: Right;   SYNDESMOSIS REPAIR Right 02/12/2017   Procedure: SYNDESMOSIS REPAIR;  Surgeon: Signa Kell, MD;  Location: [facility] ORS;  Service: Orthopedics;  Laterality: Right;   TONSILLECTOMY     TUBAL LIGATION      Medications Prior to Admission  Medication Sig Dispense Refill Last Dose   aspirin EC 81 MG tablet Take 81 mg by mouth daily. Swallow whole.   03/27/2023   atenolol (TENORMIN) 25 MG tablet Take 25 mg by mouth daily.   03/27/2023   escitalopram (LEXAPRO) 20 MG tablet Take 20 mg by mouth daily.   03/27/2023   lisinopril-hydrochlorothiazide (PRINZIDE,ZESTORETIC) 10-12.5 MG per tablet Take 1 tablet by mouth daily.    03/27/2023 at NIGHT  JARDIANCE 10 MG TABS tablet Take 10 mg by mouth daily.      metFORMIN (GLUCOPHAGE-XR) 500 MG 24 hr tablet Take 500 mg by mouth 2 (two) times daily. (Patient not taking: Reported on 03/28/2023)   Not Taking   Nutritional Supplements (ESTROVEN PO) Take 1 tablet by mouth at bedtime. Supplement for hot flashes      ondansetron (ZOFRAN ODT) 4 MG disintegrating tablet Take 1 tablet (4 mg total) by mouth every 8 (eight) hours as needed for nausea or vomiting. (Patient not taking: Reported on 03/28/2023) 20 tablet 0 Not Taking   Social History   Socioeconomic History   Marital status:  Married    Spouse name: Not on file   Number of children: Not on file   Years of education: Not on file   Highest education level: Not on file  Occupational History   Not on file  Tobacco Use   Smoking status: Former    Current packs/day: 0.00    Average packs/day: 0.3 packs/day for 35.0 years (8.8 ttl pk-yrs)    Types: Cigarettes    Quit date: 05/15/2021    Years since quitting: 1.8   Smokeless tobacco: Never  Vaping Use   Vaping status: Former  Substance and Sexual Activity   Alcohol use: No   Drug use: No   Sexual activity: Not on file  Other Topics Concern   Not on file  Social History Narrative   Not on file   Social Determinants of Health   Financial Resource Strain: Not on file  Food Insecurity: No Food Insecurity (03/29/2023)   Hunger Vital Sign    Worried About Running Out of Food in the Last Year: Never true    Ran Out of Food in the Last Year: Never true  Transportation Needs: No Transportation Needs (03/29/2023)   PRAPARE - Administrator, Civil Service (Medical): No    Lack of Transportation (Non-Medical): No  Physical Activity: Not on file  Stress: Not on file  Social Connections: Not on file  Intimate Partner Violence: Not At Risk (03/29/2023)   Humiliation, Afraid, Rape, and Kick questionnaire    Fear of Current or Ex-Partner: No    Emotionally Abused: No    Physically Abused: No    Sexually Abused: No    Family History  Problem Relation Age of Onset   Anemia Mother    Diabetes Mother    Leukemia Mother    Heart disease Father    Diabetes Father      Vitals:   03/29/23 0757 03/29/23 1124 03/29/23 1125 03/29/23 1540  BP: 134/84 136/71  (!) 107/53  Pulse: (!) 102 100 85 90  Resp: 20 20  20   Temp: 98.9 F (37.2 C) 98.6 F (37 C)  98.4 F (36.9 C)  TempSrc: Oral Oral  Oral  SpO2: (!) 82% (!) 82% 98% 98%  Weight:      Height:        PHYSICAL EXAM General: Well appearing female, well nourished, in no acute distress resting  comfortably in hospital bed. HEENT: Normocephalic and atraumatic. Neck: No JVD.  Lungs: Normal respiratory effort on room air. Clear bilaterally to auscultation. No wheezes, crackles, rhonchi.  Heart: HRRR. Normal S1 and S2 without gallops or murmurs.  Abdomen: Non-distended appearing.  Msk: Normal strength and tone for age. Extremities: Warm and well perfused. No clubbing, cyanosis. No edema.  Neuro: Alert and oriented X 3. Psych: Answers questions appropriately.   Labs: Basic Metabolic  Panel: Recent Labs    03/28/23 2042 03/29/23 0154 03/29/23 0738  NA 132* 133* 134*  K 4.4 4.1 3.8  CL 98 101 101  CO2 23 22 23   GLUCOSE 349* 323* 283*  BUN 25* 24* 21  CREATININE 1.31* 1.30* 1.12*  CALCIUM 8.4* 8.3* 8.3*  MG 1.8  --   --   PHOS 2.6  --   --    Liver Function Tests: Recent Labs    03/28/23 1508  AST 39  ALT 19  ALKPHOS 59  BILITOT 1.1  PROT 7.6  ALBUMIN 3.8   No results for input(s): "LIPASE", "AMYLASE" in the last 72 hours. CBC: Recent Labs    03/28/23 1508 03/29/23 0154  WBC 9.9 9.3  HGB 12.8 11.7*  HCT 37.0 33.2*  MCV 86.9 83.4  PLT 255 233   Cardiac Enzymes: Recent Labs    03/29/23 0154 03/29/23 0506 03/29/23 1355  TROPONINIHS 9,966* 15,281* 9,472*   BNP: No results for input(s): "BNP" in the last 72 hours. D-Dimer: No results for input(s): "DDIMER" in the last 72 hours. Hemoglobin A1C: Recent Labs    03/28/23 2042  HGBA1C 11.1*   Fasting Lipid Panel: Recent Labs    03/29/23 0154  CHOL 298*  HDL 41  LDLCALC UNABLE TO CALCULATE IF TRIGLYCERIDE OVER 400 mg/dL  TRIG 454*  CHOLHDL 7.3  LDLDIRECT 187*   Thyroid Function Tests: No results for input(s): "TSH", "T4TOTAL", "T3FREE", "THYROIDAB" in the last 72 hours.  Invalid input(s): "FREET3" Anemia Panel: No results for input(s): "VITAMINB12", "FOLATE", "FERRITIN", "TIBC", "IRON", "RETICCTPCT" in the last 72 hours.   Radiology: ECHOCARDIOGRAM COMPLETE  Result Date: 03/29/2023     ECHOCARDIOGRAM REPORT   Patient Name:   Rockelle D [name] Date of Exam: 03/29/2023 Medical Rec #:  [id number]      Height:       60.0 in Accession #:    [id number]     Weight:       157.4 lb Date of Birth:  November 18, 1955      BSA:          1.686 m Patient Age:    67 years       BP:           136/71 mmHg Patient Gender: F              HR:           100 bpm. Exam Location:  [facility] Procedure: 2D Echo, Cardiac Doppler and Color Doppler Indications:     NSTEMI I21.4  History:         Patient has no prior history of Echocardiogram examinations.                  Risk Factors:Hypertension and Dyslipidemia. Tobacco use.  Sonographer:     Cristela Blue Referring Phys:  [id number] Yosef Krogh Diagnosing Phys: Mellody Drown Alluri IMPRESSIONS  1. Left ventricular ejection fraction, by estimation, is 30 to 35%. The left ventricle has severely decreased function. The left ventricle demonstrates regional wall motion abnormalities (see scoring diagram/findings for description). Left ventricular diastolic parameters are consistent with Grade II diastolic dysfunction (pseudonormalization).  2. Right ventricular systolic function is normal. The right ventricular size is normal.  3. Moderate mitral valve regurgitation.  4. The aortic valve is normal in structure. Aortic valve regurgitation is not visualized. No aortic stenosis is present.  5. The inferior vena cava is normal in size with greater than 50% respiratory variability, suggesting right atrial  pressure of 3 mmHg. FINDINGS  Left Ventricle: Left ventricular ejection fraction, by estimation, is 30 to 35%. The left ventricle has severely decreased function. The left ventricle demonstrates regional wall motion abnormalities. The left ventricular internal cavity size was normal  in size. There is no left ventricular hypertrophy. Left ventricular diastolic parameters are consistent with Grade II diastolic dysfunction (pseudonormalization).  LV Wall Scoring: The entire apex is akinetic. The mid  anteroseptal segment, mid inferolateral segment, mid anterolateral segment, mid inferoseptal segment, mid anterior segment, and mid inferior segment are hypokinetic. The basal anteroseptal segment, basal inferolateral segment, basal anterolateral segment, basal anterior segment, basal inferior segment, and basal inferoseptal segment are normal. Right Ventricle: The right ventricular size is normal. No increase in right ventricular wall thickness. Right ventricular systolic function is normal. Left Atrium: Left atrial size was normal in size. Right Atrium: Right atrial size was normal in size. Pericardium: There is no evidence of pericardial effusion. Mitral Valve: There is mild thickening of the mitral valve leaflet(s). Moderate mitral valve regurgitation. MV peak gradient, 8.3 mmHg. The mean mitral valve gradient is 3.0 mmHg. Tricuspid Valve: The tricuspid valve is not well visualized. Tricuspid valve regurgitation is trivial. Aortic Valve: The aortic valve is normal in structure. Aortic valve regurgitation is not visualized. No aortic stenosis is present. Aortic valve mean gradient measures 4.0 mmHg. Aortic valve peak gradient measures 6.6 mmHg. Aortic valve area, by VTI measures 1.28 cm. Pulmonic Valve: The pulmonic valve was not well visualized. Pulmonic valve regurgitation is trivial. Aorta: The aortic root is normal in size and structure. Venous: The inferior vena cava is normal in size with greater than 50% respiratory variability, suggesting right atrial pressure of 3 mmHg. IAS/Shunts: The interatrial septum was not assessed.  LEFT VENTRICLE PLAX 2D LVIDd:         3.60 cm     Diastology LVIDs:         2.80 cm     LV e' medial:    6.31 cm/s LV PW:         0.80 cm     LV E/e' medial:  14.9 LV IVS:        1.20 cm     LV e' lateral:   5.77 cm/s LVOT diam:     2.00 cm     LV E/e' lateral: 16.3 LV SV:         32 LV SV Index:   19 LVOT Area:     3.14 cm  LV Volumes (MOD) LV vol d, MOD A2C: 56.4 ml LV vol d, MOD  A4C: 82.9 ml LV vol s, MOD A2C: 41.0 ml LV vol s, MOD A4C: 53.4 ml LV SV MOD A2C:     15.4 ml LV SV MOD A4C:     82.9 ml LV SV MOD BP:      22.1 ml RIGHT VENTRICLE RV S prime:     13.60 cm/s TAPSE (M-mode): 2.0 cm LEFT ATRIUM             Index        RIGHT ATRIUM           Index LA diam:        3.00 cm 1.78 cm/m   RA Area:     10.50 cm LA Vol (A2C):   9.6 ml  5.72 ml/m   RA Volume:   25.00 ml  14.83 ml/m LA Vol (A4C):   21.4 ml 12.69 ml/m LA Biplane Vol: 16.0 ml  9.49 ml/m  AORTIC VALVE AV Area (Vmax):    1.23 cm AV Area (Vmean):   1.13 cm AV Area (VTI):     1.28 cm AV Vmax:           128.33 cm/s AV Vmean:          90.600 cm/s AV VTI:            0.253 m AV Peak Grad:      6.6 mmHg AV Mean Grad:      4.0 mmHg LVOT Vmax:         50.30 cm/s LVOT Vmean:        32.700 cm/s LVOT VTI:          0.103 m LVOT/AV VTI ratio: 0.41  AORTA Ao Root diam: 2.00 cm MITRAL VALVE                TRICUSPID VALVE MV Area (PHT): 5.50 cm     TR Peak grad:   33.4 mmHg MV Area VTI:   1.26 cm     TR Vmax:        289.00 cm/s MV Peak grad:  8.3 mmHg MV Mean grad:  3.0 mmHg     SHUNTS MV Vmax:       1.44 m/s     Systemic VTI:  0.10 m MV Vmean:      84.4 cm/s    Systemic Diam: 2.00 cm MV Decel Time: 138 msec MV E velocity: 94.30 cm/s MV A velocity: 112.00 cm/s MV E/A ratio:  0.84 Windell Norfolk Electronically signed by Windell Norfolk Signature Date/Time: 03/29/2023/2:58:21 PM    Final    DG Chest 2 View  Result Date: 03/28/2023 CLINICAL DATA:  Chest pain EXAM: CHEST - 2 VIEW COMPARISON:  04/24/2012 FINDINGS: The heart size and mediastinal contours are within normal limits. Aortic atherosclerosis. Both lungs are clear. The visualized skeletal structures are unremarkable. IMPRESSION: No active cardiopulmonary disease. Electronically Signed   By: Duanne Guess D.O.   On: 03/28/2023 17:36    ECHO pending  TELEMETRY reviewed by me 03/29/2023: sinus rhythm rate 80s  EKG reviewed by me: sinus rhythm rate 76 bpm, nonacute  Data  reviewed by me 03/29/2023: last 24h vitals tele labs imaging I/O ED provider note, admission H&P  Principal Problem:   NSTEMI (non-ST elevated myocardial infarction) (HCC) Active Problems:   Hyperlipidemia   Hypertension   Smoking   Type II diabetes mellitus with renal manifestations (HCC)   Chronic kidney disease, stage 3a (HCC)   Obesity (BMI 30-39.9)   Hyponatremia   Depression   Uncontrolled type 2 diabetes mellitus with hyperglycemia, with long-term current use of insulin (HCC)    ASSESSMENT AND PLAN:  Evyn D [name] is a 68 y.o. female  with a past medical history of SVT, hypertension, hyperlipidemia, type II diabetes, chronic kidney disease stage III, tobacco use, obesity who presented to the ED on 03/28/2023 for chest pain. Cardiology was consulted for further evaluation.   # NSTEMI # Hypertension # Hyperlipidemia Patient presented with CP described as tightness beginning yesterday morning and persisting. Reports recent similar episodes. Troponins 2218 > 5020 > 8693 > 9479 > 9966 > 15281 > 9472. EKG with NSR, no acute ST-T changes.  -S/p aspirin 325 mg in the ED. Continue aspirin 81 mg daily.  -Continue heparin infusion. -Continue atenolol 25 mg daily.  -Continue nitroglycerin infusion.  -Plan for [facility] tomorrow with Dr. Kirke Corin. Risks and benefits discussed in detail with patient. She  is amenable to proceed. Written consent to be obtained.  # Chronic kidney disease stage IIIa Cr ~1.3 since admission, no recent labs for comparison.  -Continue to monitor renal function closely.    This patient's plan of care was discussed and created with Dr. Corky Sing and he is in agreement.  Signed: Gale Journey, PA-C  03/29/2023, 3:49 PM [facility] Cardiology

## 2023-03-29 NOTE — Discharge Instructions (Signed)
Some PCP options in [place] area- not a comprehensive list  [facility]- [phone] [place]- [phone] [facility]- [phone] [facility]- [phone] [facility]- [phone] [place]- [phone]  or [place] [facility]erral Line [phone]  

## 2023-03-29 NOTE — Consult Note (Signed)
Pharmacy Consult Note - Anticoagulation  Pharmacy Consult for heparin Indication: chest pain/ACS  PATIENT MEASUREMENTS: Height: 5' (152.4 cm) Weight: 71.4 kg (157 lb 6.4 oz) IBW/kg (Calculated) : 45.5 HEPARIN DW (KG): 63.6  VITAL SIGNS: Temp: 98.9 F (37.2 C) (11/13 2338) Temp Source: Oral (11/13 2040) BP: 128/74 (11/14 0143) Pulse Rate: 85 (11/14 0143)  Recent Labs    03/28/23 1642 03/28/23 2042 03/29/23 0154  HGB  --   --  11.7*  HCT  --   --  33.2*  PLT  --   --  233  APTT 20*  --   --   LABPROT 13.8  --   --   INR 1.0  --   --   HEPARINUNFRC  --    < > 0.15*  CREATININE  --    < > 1.30*  TROPONINIHS 5,020*   < > 9,966*   < > = values in this interval not displayed.    Estimated Creatinine Clearance: 38.1 mL/min (A) (by C-G formula based on SCr of 1.3 mg/dL (H)).  PAST MEDICAL HISTORY: Past Medical History:  Diagnosis Date   Arthritis of hip    Chest pain    a. 11/2011 Lexi MV: No ischemia/infarct, EF 46%.   Complication of anesthesia    nausea/vomiting   Depression    HTN (hypertension)    Hyperlipidemia    Migraines    PONV (postoperative nausea and vomiting)    Tachycardia    Tobacco abuse    Type II or unspecified type diabetes mellitus without mention of complication, not stated as uncontrolled    Unspecified congenital anomaly of eye     ASSESSMENT: 67 y.o. female with PMH including HTN, HLD, DM, smoking is presenting with left center chest pain with vomiting. cTn trending up and ECG concerning for ischemic changes Patient is not on chronic anticoagulation per chart review. Pharmacy has been consulted to initiate and manage heparin intravenous infusion.  Pertinent medications: No chronic AC prior to admission per chart review  Goal(s) of therapy: Heparin level 0.3 - 0.7 units/mL Monitor platelets by anticoagulation protocol: Yes   Baseline anticoagulation labs: Recent Labs    03/28/23 1508 03/28/23 1642 03/29/23 0154  APTT  --  20*  --    INR  --  1.0  --   HGB 12.8  --  11.7*  PLT 255  --  233    Date Time aPTT/HL Rate/Comment 11/14 0154 HL 0.15 subtherapeutic   PLAN: Give 1900 units bolus x 1 Increase heparin infusion to 950 units/hour. Recheck heparin level in 6 hours after rate change, then daily once at least two levels are consecutively therapeutic. Monitor CBC daily while on heparin infusion.  Otelia Sergeant, PharmD, [facility] 03/29/2023 2:57 AM

## 2023-03-29 NOTE — Inpatient Diabetes Management (Addendum)
Inpatient Diabetes Program Recommendations  AACE/ADA: New Consensus Statement on Inpatient Glycemic Control (2015)  Target Ranges:  Prepandial:   less than 140 mg/dL      Peak postprandial:   less than 180 mg/dL (1-2 hours)      Critically ill patients:  140 - 180 mg/dL    Latest Reference Range & Units 03/28/23 15:08  Glucose 70 - 99 mg/dL 621 (H)  (H): Data is abnormally high  Latest Reference Range & Units 03/28/23 20:42  Hemoglobin A1C 4.8 - 5.6 % 11.1 (H)  271 mg/dl  (H): Data is abnormally high  Latest Reference Range & Units 03/28/23 18:21 03/28/23 21:56 03/28/23 23:47  Glucose-Capillary 70 - 99 mg/dL 308 (H)  8 units Novolog  303 (H)    10 units Semglee 340 (H)  4 units Novolog   (H): Data is abnormally high  Latest Reference Range & Units 03/29/23 08:04  Glucose-Capillary 70 - 99 mg/dL 657 (H)  (H): Data is abnormally high   Admit with: CP/ NSTEMI  History: DM, CKD  Home DM Meds: Metformin 500 mg BID (NOT taking)       Jardiance 10 mg daily (NOT taking)  Current Orders: Novolog Sensitive Correction Scale/ SSI (0-9 units) TID AC + HS     Semglee 25 units Daily    Note Semglee started last PM--Dose increased to 25 units this AM  Current A1c shows very poor glucose control at home  MD- Do you suspect pt will need insulin for home?  Has  NOT been taking her oral DM meds Jardiance or Metformin.   Addendum 12pm--Met w/ pt at bedside (husband also present).  Pt told me she has CBG meter at home but ran out of strips weeks ago.  Recently started back at the [facility] clinic in Sept and her A1c was around 12% (per pt report).  Told me her MD Stopped the Metformin and started her on Victoza injections daily.  A month later, Victoza was stopped and pt was started on Lantus daily (can't remember dose).  A1c came down to 8% range and then her MD stopped the Lantus about 2-3 weeks ago and gave pt Rx to [employer] daily at home.  Pt said she picked her  Jardiance up but never got a chance to start it.  Spoke with patient about her current A1c of 11.1%.  Explained what an A1c is and what it measures.  Reminded patient that her goal A1c is 7% or less per ADA standards to prevent both acute and long-term complications and especially to prevent further cardiac complications like CVA and MI.  Explained to patient the extreme importance of good glucose control at home.  Reviewed goal CBGs for home.  Encouraged patient to check her CBGs at least bid at home (fasting and another check within the day) and to record all CBGs in a logbook for her PCP  to review.  Discussed w/ pt that the MD will likely have her restart Lantus insulin at home given her high A1c and admission with MI.  Pt stated she was OK with this.  Did not have questions about self injecting with insulin pens at home and husband of pt told me he can help her b/c he has Type 1 diabetes and knows how to inject.  Pt gets Rxs filled at the [facility].    --Will follow patient during hospitalization--  Ambrose Finland RN, MSN, CDCES Diabetes Coordinator Inpatient Glycemic Control Team  Team Pager: [phone] (8a-5p)

## 2023-03-29 NOTE — Consult Note (Signed)
Pharmacy Consult Note - Anticoagulation  Pharmacy Consult for heparin Indication: chest pain/ACS  PATIENT MEASUREMENTS: Height: 5' (152.4 cm) Weight: 71.4 kg (157 lb 6.4 oz) IBW/kg (Calculated) : 45.5 HEPARIN DW (KG): 63.6  VITAL SIGNS: Temp: 98.6 F (37 C) (11/14 1124) Temp Source: Oral (11/14 1124) BP: 136/71 (11/14 1124) Pulse Rate: 85 (11/14 1125)  Recent Labs    03/28/23 1642 03/28/23 2042 03/29/23 0154 03/29/23 0506 03/29/23 0738 03/29/23 1130 03/29/23 1355  HGB  --   --  11.7*  --   --   --   --   HCT  --   --  33.2*  --   --   --   --   PLT  --   --  233  --   --   --   --   APTT 20*  --   --   --   --  61*  --   LABPROT 13.8  --   --   --   --   --   --   INR 1.0  --   --   --   --   --   --   HEPARINUNFRC  --    < > 0.15*  --   --  0.51  --   CREATININE  --    < > 1.30*  --  1.12*  --   --   TROPONINIHS 5,020*   < > 9,966*   < >  --   --  9,472*   < > = values in this interval not displayed.    Estimated Creatinine Clearance: 44.2 mL/min (A) (by C-G formula based on SCr of 1.12 mg/dL (H)).  PAST MEDICAL HISTORY: Past Medical History:  Diagnosis Date   Arthritis of hip    Chest pain    a. 11/2011 Lexi MV: No ischemia/infarct, EF 46%.   Complication of anesthesia    nausea/vomiting   Depression    HTN (hypertension)    Hyperlipidemia    Migraines    PONV (postoperative nausea and vomiting)    Tachycardia    Tobacco abuse    Type II or unspecified type diabetes mellitus without mention of complication, not stated as uncontrolled    Unspecified congenital anomaly of eye     ASSESSMENT: 67 y.o. female with PMH including HTN, HLD, DM, smoking is presenting with left center chest pain with vomiting.  Patient is not on chronic anticoagulation per chart review. Pharmacy has been consulted to initiate and manage heparin intravenous infusion.  Pertinent medications: No chronic AC prior to admission per chart review  Goal(s) of therapy: Heparin level  0.3 - 0.7 units/mL Monitor platelets by anticoagulation protocol: Yes   Baseline anticoagulation labs: Recent Labs    03/28/23 1508 03/28/23 1642 03/29/23 0154 03/29/23 1130  APTT  --  20*  --  61*  INR  --  1.0  --   --   HGB 12.8  --  11.7*  --   PLT 255  --  233  --       PLAN: aPTT therapeutic x 1 continue heparin infusion at 1050 units/hour  recheck aPTT level in 6 hours to confirm Patient has TG > 400; manage heparin by aPTT  Monitor CBC daily while on heparin infusion.  Lowella Bandy, PharmD, BCPS 03/29/2023 3:12 PM

## 2023-03-29 NOTE — Progress Notes (Signed)
  Progress Note   [name] ZOX:[id number] DOB: 06/20/1955 DOA: 03/28/2023     1 DOS: the patient was seen and examined on 03/29/2023   Brief hospital course: Andrea [name] is a 67 y.o. female with medical history significant of HTN, HL:D, DM, depression, CKD-3a, tobacco abuse, obesity, SVT, migraine headache, panic attacks, who presents with chest pain.  Troponin peak at 15,281, EKG did not show any ST elevation.  Cardiology consult obtained.  Patient placed on heparin drip, aspirin and beta blocker.  Patient could not tolerate the statin.   Principal Problem:   NSTEMI (non-ST elevated myocardial infarction) (HCC) Active Problems:   Hyponatremia   Hyperlipidemia   Hypertension   Type II diabetes mellitus with renal manifestations (HCC)   Chronic kidney disease, stage 3a (HCC)   Smoking   Obesity (BMI 30-39.9)   Depression   Uncontrolled type 2 diabetes mellitus with hyperglycemia, with long-term current use of insulin (HCC)   Assessment and Plan: NSTEMI (non-ST elevated myocardial infarction) [facility]): Dyslipidemia. Peak troponin over 15,000, no ST-T elevation.  Consult from cardiology obtained, pending workup.  Will continue heparin drip, patient still has some mild chest pain, on nitroglycerin drip.  Continue aspirin beta-blocker. Patient also had significant dyslipidemia, cannot tolerate statin, added Zetia.   Hyponatremia:  Appears to be secondary to hydrochlorothiazide.  Sodium level improving after fluids.  Hold off HCTZ.   Hypertension -IV hydralazine as needed -Atenolol    Type II diabetes mellitus with renal manifestations (HCC) Uncontrolled type 2 diabetes with hyperglycemia.:  Hemoglobin A1c 11.1.  Will need insulin at discharge.  Will start insulin glargine was increased dose of 25 units, scheduled NovoLog and sliding scale insulin.  Adjust dose as needed.    Chronic kidney disease, stage 3a [facility]): Renal function still stable, on gentle rehydration  in preparation for heart cath.   Smoking -Nicotine patch -Counseled about importance of quitting smoking   Obesity (BMI 30-39.9): Body weight 71.4 kg, BMI 30.74 -Encourage losing weight -Exercise and healthy diet   Depression -Lexapro           Subjective:  Patient still has some chest tightness today, but feels much better than yesterday.  No shortness of breath.  Physical Exam: Vitals:   03/29/23 0318 03/29/23 0757 03/29/23 1124 03/29/23 1125  BP: (!) 118/54 134/84 136/71   Pulse: 81 (!) 102 100 85  Resp: 18 20 20    Temp: 98.4 F (36.9 C) 98.9 F (37.2 C) 98.6 F (37 C)   TempSrc: Oral Oral Oral   SpO2: 100% (!) 82% (!) 82% 98%  Weight:      Height:       General exam: Appears calm and comfortable  Respiratory system: Clear to auscultation. Respiratory effort normal. Cardiovascular system: S1 & S2 heard, RRR. No JVD, murmurs, rubs, gallops or clicks. No pedal edema. Gastrointestinal system: Abdomen is nondistended, soft and nontender. No organomegaly or masses felt. Normal bowel sounds heard. Central nervous system: Alert and oriented. No focal neurological deficits. Extremities: Symmetric 5 x 5 power. Skin: No rashes, lesions or ulcers Psychiatry: Judgement and insight appear normal. Mood & affect appropriate.    Data Reviewed:  Lab results reviewed.  Family Communication: None  Disposition: Status is: Inpatient Remains inpatient appropriate because: Severity of disease, IV treatment.  Inpatient procedure.     Time spent: 50 minutes  Author: Marrion Coy, MD 03/29/2023 12:09 PM  For on call review [email or web address].

## 2023-03-30 ENCOUNTER — Other Ambulatory Visit (HOSPITAL_COMMUNITY): Payer: Self-pay

## 2023-03-30 ENCOUNTER — Encounter
Admission: EM | Disposition: A | Discharge status: Home / Self Care | Payer: Self-pay | Source: Home / Self Care | Attending: Internal Medicine

## 2023-03-30 ENCOUNTER — Encounter: Payer: Self-pay | Admitting: Cardiovascular Disease

## 2023-03-30 DIAGNOSIS — I214 Non-ST elevation (NSTEMI) myocardial infarction: Secondary | ICD-10-CM | POA: Diagnosis not present

## 2023-03-30 DIAGNOSIS — N1831 Chronic kidney disease, stage 3a: Secondary | ICD-10-CM | POA: Diagnosis not present

## 2023-03-30 DIAGNOSIS — I2510 Atherosclerotic heart disease of native coronary artery without angina pectoris: Secondary | ICD-10-CM | POA: Diagnosis not present

## 2023-03-30 DIAGNOSIS — Z794 Long term (current) use of insulin: Secondary | ICD-10-CM | POA: Diagnosis not present

## 2023-03-30 DIAGNOSIS — E1165 Type 2 diabetes mellitus with hyperglycemia: Secondary | ICD-10-CM | POA: Diagnosis not present

## 2023-03-30 HISTORY — PX: LEFT HEART CATH AND CORONARY ANGIOGRAPHY: CATH118249

## 2023-03-30 LAB — MAGNESIUM
Magnesium: 1.6 mg/dL — ABNORMAL LOW (ref 1.7–2.4)
Magnesium: 1.5 mg/dL — ABNORMAL LOW (ref 1.7–2.4)

## 2023-03-30 LAB — BASIC METABOLIC PANEL
Anion gap: 9 (ref 5–15)
Anion gap: 6 (ref 5–15)
BUN: 15 mg/dL (ref 8–23)
BUN: 16 mg/dL (ref 8–23)
CO2: 21 mmol/L — ABNORMAL LOW (ref 22–32)
CO2: 23 mmol/L (ref 22–32)
Calcium: 8.3 mg/dL — ABNORMAL LOW (ref 8.9–10.3)
Calcium: 7.8 mg/dL — ABNORMAL LOW (ref 8.9–10.3)
Chloride: 106 mmol/L (ref 98–111)
Chloride: 101 mmol/L (ref 98–111)
Creatinine, Ser: 0.89 mg/dL (ref 0.44–1.00)
Creatinine, Ser: 1.15 mg/dL — ABNORMAL HIGH (ref 0.44–1.00)
GFR, Estimated: >60 mL/min (ref >60)
GFR, Estimated: 53 mL/min — ABNORMAL LOW (ref >60)
Glucose, Bld: 139 mg/dL — ABNORMAL HIGH (ref 70–99)
Glucose, Bld: 245 mg/dL — ABNORMAL HIGH (ref 70–99)
Potassium: 3.5 mmol/L (ref 3.5–5.1)
Potassium: 3.8 mmol/L (ref 3.5–5.1)
Sodium: 136 mmol/L (ref 135–145)
Sodium: 130 mmol/L — ABNORMAL LOW (ref 135–145)

## 2023-03-30 LAB — CBC
HCT: 31.3 % — ABNORMAL LOW (ref 36.0–46.0)
Hemoglobin: 10.9 g/dL — ABNORMAL LOW (ref 12.0–15.0)
MCH: 29.8 pg (ref 26.0–34.0)
MCHC: 34.8 g/dL (ref 30.0–36.0)
MCV: 85.5 fL (ref 80.0–100.0)
Platelets: 206 10*3/uL (ref 150–400)
RBC: 3.66 MIL/uL — ABNORMAL LOW (ref 3.87–5.11)
RDW: 12.9 % (ref 11.5–15.5)
WBC: 8.9 10*3/uL (ref 4.0–10.5)
nRBC: 0 % (ref 0.0–0.2)

## 2023-03-30 LAB — APTT: aPTT: 86 s — ABNORMAL HIGH (ref 24–36)

## 2023-03-30 LAB — GLUCOSE, CAPILLARY
Glucose-Capillary: 168 mg/dL — ABNORMAL HIGH (ref 70–99)
Glucose-Capillary: 333 mg/dL — ABNORMAL HIGH (ref 70–99)
Glucose-Capillary: 418 mg/dL — ABNORMAL HIGH (ref 70–99)

## 2023-03-30 SURGERY — LEFT HEART CATH AND CORONARY ANGIOGRAPHY
Anesthesia: Moderate Sedation

## 2023-03-30 MED ORDER — SODIUM CHLORIDE 0.9 % WEIGHT BASED INFUSION
3.0000 mL/kg/h | INTRAVENOUS | Status: DC
  Administered 2023-03-30: 3 mL/kg/h via INTRAVENOUS

## 2023-03-30 MED ORDER — SODIUM CHLORIDE 0.9 % IV SOLN
250.0000 mL | INTRAVENOUS | Status: AC | PRN
Start: 2023-03-30 — End: 2023-03-31

## 2023-03-30 MED ORDER — HEPARIN SODIUM (PORCINE) 1000 UNIT/ML IJ SOLN
INTRAMUSCULAR | Status: DC | PRN
  Administered 2023-03-30: 3500 [IU] via INTRAVENOUS

## 2023-03-30 MED ORDER — SODIUM CHLORIDE 0.9% FLUSH: 3.0000 mL | INTRAVENOUS | Status: DC | PRN

## 2023-03-30 MED ORDER — CLOPIDOGREL BISULFATE 75 MG PO TABS
300.0000 mg | ORAL_TABLET | Freq: Once | ORAL | Status: AC
  Administered 2023-03-30: 300 mg via ORAL

## 2023-03-30 MED ORDER — CLOPIDOGREL BISULFATE 75 MG PO TABS
ORAL_TABLET | ORAL | Status: AC
Start: 2023-03-30 — End: ?
  Filled 2023-03-30: qty 4

## 2023-03-30 MED ORDER — FENTANYL CITRATE (PF) 100 MCG/2ML IJ SOLN
INTRAMUSCULAR | Status: AC
  Filled 2023-03-30: qty 2

## 2023-03-30 MED ORDER — CLOPIDOGREL BISULFATE 75 MG PO TABS: 75.0000 mg | ORAL_TABLET | Freq: Every day | ORAL | 0 refills | Status: AC

## 2023-03-30 MED ORDER — BLOOD GLUCOSE TEST VI STRP: 1.0000 | ORAL_STRIP | Freq: Three times a day (TID) | 0 refills | Status: AC

## 2023-03-30 MED ORDER — HYALURONIDASE HUMAN 150 UNIT/ML IJ SOLN
150.0000 [IU] | Freq: Once | INTRAMUSCULAR | Status: AC
  Administered 2023-03-30: 150 [IU] via SUBCUTANEOUS
  Filled 2023-03-30: qty 1

## 2023-03-30 MED ORDER — CARVEDILOL 3.125 MG PO TABS: 3.1250 mg | ORAL_TABLET | Freq: Two times a day (BID) | ORAL | 0 refills | Status: AC

## 2023-03-30 MED ORDER — ENOXAPARIN SODIUM 40 MG/0.4ML IJ SOSY
40.0000 mg | PREFILLED_SYRINGE | INTRAMUSCULAR | Status: DC
  Administered 2023-03-31: 40 mg via SUBCUTANEOUS
  Filled 2023-03-30: qty 0.4

## 2023-03-30 MED ORDER — SODIUM CHLORIDE 0.9 % IV SOLN
250.0000 mL | INTRAVENOUS | Status: DC | PRN
Start: 2023-03-30 — End: 2023-03-30

## 2023-03-30 MED ORDER — MAGNESIUM SULFATE 2 GM/50ML IV SOLN: 2.0000 g | Freq: Once | INTRAVENOUS | Status: DC

## 2023-03-30 MED ORDER — EZETIMIBE 10 MG PO TABS: 10.0000 mg | ORAL_TABLET | Freq: Every day | ORAL | 0 refills | Status: AC

## 2023-03-30 MED ORDER — LANCETS MISC: 1.0000 | Freq: Three times a day (TID) | 0 refills | Status: AC

## 2023-03-30 MED ORDER — MAGNESIUM SULFATE 4 GM/100ML IV SOLN
4.0000 g | Freq: Once | INTRAVENOUS | Status: AC
  Administered 2023-03-30: 4 g via INTRAVENOUS
  Filled 2023-03-30: qty 100

## 2023-03-30 MED ORDER — FENTANYL CITRATE (PF) 100 MCG/2ML IJ SOLN
INTRAMUSCULAR | Status: DC | PRN
  Administered 2023-03-30: 25 ug via INTRAVENOUS

## 2023-03-30 MED ORDER — HEPARIN (PORCINE) IN NACL 2000-0.9 UNIT/L-% IV SOLN
INTRAVENOUS | Status: DC | PRN
  Administered 2023-03-30: 1000 mL

## 2023-03-30 MED ORDER — VERAPAMIL HCL 2.5 MG/ML IV SOLN
INTRAVENOUS | Status: AC
  Filled 2023-03-30: qty 2

## 2023-03-30 MED ORDER — VERAPAMIL HCL 2.5 MG/ML IV SOLN
INTRAVENOUS | Status: DC | PRN
  Administered 2023-03-30 (×2): 2.5 mg via INTRAVENOUS

## 2023-03-30 MED ORDER — SODIUM CHLORIDE 0.9% FLUSH
3.0000 mL | Freq: Two times a day (BID) | INTRAVENOUS | Status: DC
  Administered 2023-03-30: 3 mL via INTRAVENOUS

## 2023-03-30 MED ORDER — CARVEDILOL 3.125 MG PO TABS
3.1250 mg | ORAL_TABLET | Freq: Two times a day (BID) | ORAL | Status: DC
  Administered 2023-03-30 – 2023-03-31 (×2): 3.125 mg via ORAL
  Filled 2023-03-30 (×2): qty 1

## 2023-03-30 MED ORDER — CLOPIDOGREL BISULFATE 75 MG PO TABS
75.0000 mg | ORAL_TABLET | Freq: Every day | ORAL | Status: DC
  Administered 2023-03-31: 75 mg via ORAL
  Filled 2023-03-30: qty 1

## 2023-03-30 MED ORDER — NOVOLIN R FLEXPEN RELION 100 UNIT/ML IJ SOPN: 8.0000 [IU] | PEN_INJECTOR | Freq: Three times a day (TID) | INTRAMUSCULAR | 0 refills | Status: AC

## 2023-03-30 MED ORDER — BLOOD GLUCOSE MONITORING SUPPL DEVI: 1.0000 | Freq: Three times a day (TID) | 0 refills | Status: AC

## 2023-03-30 MED ORDER — HEPARIN SODIUM (PORCINE) 1000 UNIT/ML IJ SOLN
INTRAMUSCULAR | Status: AC
  Filled 2023-03-30: qty 10

## 2023-03-30 MED ORDER — POTASSIUM CHLORIDE CRYS ER 20 MEQ PO TBCR: 40.0000 meq | EXTENDED_RELEASE_TABLET | Freq: Once | ORAL | Status: DC

## 2023-03-30 MED ORDER — MIDAZOLAM HCL 2 MG/2ML IJ SOLN
INTRAMUSCULAR | Status: AC
  Filled 2023-03-30: qty 2

## 2023-03-30 MED ORDER — MIDAZOLAM HCL 2 MG/2ML IJ SOLN
INTRAMUSCULAR | Status: DC | PRN
  Administered 2023-03-30: 1 mg via INTRAVENOUS

## 2023-03-30 MED ORDER — PEN NEEDLES 31G X 5 MM MISC: 1.0000 | Freq: Three times a day (TID) | 0 refills | Status: AC

## 2023-03-30 MED ORDER — INSULIN GLARGINE 100 UNIT/ML SOLOSTAR PEN: 25.0000 [IU] | PEN_INJECTOR | Freq: Every day | SUBCUTANEOUS | 0 refills | Status: AC

## 2023-03-30 MED ORDER — HEPARIN (PORCINE) IN NACL 1000-0.9 UT/500ML-% IV SOLN
INTRAVENOUS | Status: AC
  Filled 2023-03-30: qty 1000

## 2023-03-30 MED ORDER — SODIUM CHLORIDE 0.9 % WEIGHT BASED INFUSION
1.0000 mL/kg/h | INTRAVENOUS | Status: DC
  Administered 2023-03-30: 1 mL/kg/h via INTRAVENOUS

## 2023-03-30 MED ORDER — SODIUM CHLORIDE 0.9% FLUSH: 3.0000 mL | Freq: Two times a day (BID) | INTRAVENOUS | Status: DC

## 2023-03-30 MED ORDER — LANCET DEVICE MISC: 1.0000 | Freq: Three times a day (TID) | 0 refills | Status: AC

## 2023-03-30 MED ORDER — IOHEXOL 300 MG/ML  SOLN
INTRAMUSCULAR | Status: DC | PRN
  Administered 2023-03-30: 60 mL

## 2023-03-30 SURGICAL SUPPLY — 12 items
CATH INFINITI 5 FR JL3.5 (CATHETERS) IMPLANT
CATH INFINITI AMBI 5FR JK (CATHETERS) IMPLANT
CATH INFINITI JR4 5F (CATHETERS) IMPLANT
DEVICE RAD TR BAND REGULAR (VASCULAR PRODUCTS) IMPLANT
DRAPE BRACHIAL (DRAPES) IMPLANT
GLIDESHEATH SLEND SS 6F .021 (SHEATH) IMPLANT
GUIDEWIRE INQWIRE 1.5J.035X260 (WIRE) IMPLANT
INQWIRE 1.5J .035X260CM (WIRE) ×1
PACK CARDIAC CATH (CUSTOM PROCEDURE TRAY) ×1 IMPLANT
PROTECTION STATION PRESSURIZED (MISCELLANEOUS) ×1
SET ATX-X65L (MISCELLANEOUS) IMPLANT
STATION PROTECTION PRESSURIZED (MISCELLANEOUS) IMPLANT

## 2023-03-30 NOTE — Progress Notes (Signed)
Upon pt coming to [facility] to be prepped for cath, RN noticed that L AC IV had infiltrated with nitroglycerin infusing. Stopped nitroglycerin and IV removed. Called pharmacist and they recommended hylenex injection at site. Notified hospitalist of this and order was placed. Drug was administered. Arm elevated on pillow and warm compress applied. Dr. Kirke Corin notified as pt was being prepped for cardiac cath. He wanted hylenex administered prior to cath so cath time was delayed.

## 2023-03-30 NOTE — TOC Benefit Eligibility Note (Signed)
Patient Product/process development scientist completed.    The patient is insured through [facility] MEDICAID.     Ran test claim for icosapent Ethyl (Vascepa) 1 g and the current 30 day co-pay is $4.00.  Ran test claim for omega-3 acid ethyl Esters (Lovaza) 1 g and the current 30 day co-pay is $4.00.  Ran test claim for Repathat Sureclick 140 mg/ml Soaj and Requires Prior Authorization  This test claim was processed through [employer]- copay amounts may vary at other pharmacies due to [employer], or as the patient moves through the different stages of their insurance plan.     Roland Earl, CPHT Pharmacy Technician III Certified Patient Advocate [facility] Pharmacy Patient Advocate Team Direct Number: [phone]  Fax: [phone]

## 2023-03-30 NOTE — Progress Notes (Signed)
Progress Note   [name] JYN:[id number] DOB: [birth year] DOA: 03/28/2023     2 DOS: the patient was seen and examined on 03/30/2023   Brief hospital course: Andrea [name] is a 67 y.o. female with medical history significant of HTN, HL:D, DM, depression, CKD-3a, tobacco abuse, obesity, SVT, migraine headache, panic attacks, who presents with chest pain.  Troponin peak at 15,281, EKG did not show any ST elevation.  Cardiology consult obtained.  Patient placed on heparin drip, aspirin and beta blocker.  Patient could not tolerate the statin. Chronic angiogram was performed on 11/15, showed severe one-vessel disease with mid LAD.  Decision was made to treat medically.   Principal Problem:   NSTEMI (non-ST elevated myocardial infarction) (HCC) Active Problems:   Hyponatremia   Hyperlipidemia   Hypertension   Type II diabetes mellitus with renal manifestations (HCC)   Chronic kidney disease, stage 3a (HCC)   Smoking   Obesity (BMI 30-39.9)   Depression   Uncontrolled type 2 diabetes mellitus with hyperglycemia, with long-term current use of insulin (HCC)   Assessment and Plan: NSTEMI (non-ST elevated myocardial infarction) [facility]): Dyslipidemia. Peak troponin over 15,000, no ST-T elevation.  Seen by cardiology, continued on heparin and nitroglycerin drip.  Continue aspirin beta-blocker. Patient also had significant dyslipidemia, cannot tolerate statin, added Zetia. Heart cath was performed today, showed severe one-vessel disease in LAD.  Decision made to treat medically.  Patient will be monitored overnight and discharge tomorrow. Medication has sent to pharmacy today.      Hypertension -IV hydralazine as needed -Atenolol     Type II diabetes mellitus with renal manifestations (HCC) Uncontrolled type 2 diabetes with hyperglycemia.:  Hemoglobin A1c 11.1.  Will need insulin at discharge.  Will start insulin glargine was increased dose of 25 units, scheduled NovoLog and  sliding scale insulin.  Patient will need insulin injection at discharge, insulin prescription and supplies sent to the pharmacy.  Patient will need education before discharge.  Notified RN, also consult diabetic coordinator.     Chronic kidney disease, stage 3a (HCC): Hyponatremia:  Renal function stable, sodium level better.  Recheck level tomorrow.   Smoking -Nicotine patch -Counseled about importance of quitting smoking   Obesity (BMI 30-39.9): Body weight 71.4 kg, BMI 30.74 -Encourage losing weight -Exercise and healthy diet   Depression -Lexapro         Subjective:  Patient no longer has any chest pain or shortness of breath.  Physical Exam: Vitals:   03/30/23 1245 03/30/23 1315 03/30/23 1330 03/30/23 1346  BP: 115/66 119/71 104/70 97/71  Pulse: 93 94 91 93  Resp: 20 18 20 16   Temp:    99.1 F (37.3 C)  TempSrc:    Oral  SpO2: 96% 97% 97% 99%  Weight:      Height:       General exam: Appears calm and comfortable  Respiratory system: Clear to auscultation. Respiratory effort normal. Cardiovascular system: S1 & S2 heard, RRR. No JVD, murmurs, rubs, gallops or clicks. No pedal edema. Gastrointestinal system: Abdomen is nondistended, soft and nontender. No organomegaly or masses felt. Normal bowel sounds heard. Central nervous system: Alert and oriented. No focal neurological deficits. Extremities: Symmetric 5 x 5 power. Skin: No rashes, lesions or ulcers Psychiatry: Judgement and insight appear normal. Mood & affect appropriate.    Data Reviewed:  Results reviewed.  Family Communication: Husband and daughter updated at bedside.  Disposition: Status is: Inpatient Remains inpatient appropriate because: Severity of disease.  IV treatment.     Time spent: 35 minutes  Author: Marrion Coy, MD 03/30/2023 2:37 PM  For on call review [email or web address].

## 2023-03-30 NOTE — Progress Notes (Signed)
[facility] Cardiology  CARDIOLOGY PROGRESS NOTE  Patient ID: Andrea [name] MRN: [id number] DOB/AGE: [birth year] 67 y.o.  Admit date: 03/28/2023 Referring Physician Dr. Clyde Lundborg Primary Cardiologist Dr. Darrold Junker Reason for Consultation NSTEMI  HPI: Patient presented with chest pain and had significant troponin elevation.  Echo with reduced LVEF.  Scheduled today for left heart catheterization.  Did not see patient today, not in room, went to Cath Lab.  Review of systems complete and found to be negative unless listed above     Past Medical History:  Diagnosis Date   Arthritis of hip    Chest pain    a. 11/2011 Lexi MV: No ischemia/infarct, EF 46%.   Complication of anesthesia    nausea/vomiting   Depression    HTN (hypertension)    Hyperlipidemia    Migraines    PONV (postoperative nausea and vomiting)    Tachycardia    Tobacco abuse    Type II or unspecified type diabetes mellitus without mention of complication, not stated as uncontrolled    Unspecified congenital anomaly of eye     Past Surgical History:  Procedure Laterality Date   CARDIAC CATHETERIZATION  Sep 25, 2000   Dr. Gwen Pounds @ [facility] which was negative.   CESAREAN SECTION     x 2   ORIF ANKLE FRACTURE Right 02/12/2017   Procedure: OPEN REDUCTION INTERNAL FIXATION (ORIF) ANKLE FRACTURE;  Surgeon: Signa Kell, MD;  Location: [facility] ORS;  Service: Orthopedics;  Laterality: Right;   SYNDESMOSIS REPAIR Right 02/12/2017   Procedure: SYNDESMOSIS REPAIR;  Surgeon: Signa Kell, MD;  Location: [facility] ORS;  Service: Orthopedics;  Laterality: Right;   TONSILLECTOMY     TUBAL LIGATION      Medications Prior to Admission  Medication Sig Dispense Refill Last Dose   aspirin EC 81 MG tablet Take 81 mg by mouth daily. Swallow whole.   03/27/2023   atenolol (TENORMIN) 25 MG tablet Take 25 mg by mouth daily.   03/27/2023   escitalopram (LEXAPRO) 20 MG tablet Take 20 mg by mouth daily.   03/27/2023   lisinopril-hydrochlorothiazide  (PRINZIDE,ZESTORETIC) 10-12.5 MG per tablet Take 1 tablet by mouth daily.    03/27/2023 at NIGHT   JARDIANCE 10 MG TABS tablet Take 10 mg by mouth daily.      metFORMIN (GLUCOPHAGE-XR) 500 MG 24 hr tablet Take 500 mg by mouth 2 (two) times daily. (Patient not taking: Reported on 03/28/2023)   Not Taking   Nutritional Supplements (ESTROVEN PO) Take 1 tablet by mouth at bedtime. Supplement for hot flashes      ondansetron (ZOFRAN ODT) 4 MG disintegrating tablet Take 1 tablet (4 mg total) by mouth every 8 (eight) hours as needed for nausea or vomiting. (Patient not taking: Reported on 03/28/2023) 20 tablet 0 Not Taking   Social History   Socioeconomic History   Marital status: Married    Spouse name: Not on file   Number of children: Not on file   Years of education: Not on file   Highest education level: Not on file  Occupational History   Not on file  Tobacco Use   Smoking status: Former    Current packs/day: 0.00    Average packs/day: 0.3 packs/day for 35.0 years (8.8 ttl pk-yrs)    Types: Cigarettes    Quit date: 05/15/2021    Years since quitting: 1.8   Smokeless tobacco: Never  Vaping Use   Vaping status: Former  Substance and Sexual Activity   Alcohol use: No  Drug use: No   Sexual activity: Not on file  Other Topics Concern   Not on file  Social History Narrative   Not on file   Social Determinants of Health   Financial Resource Strain: Not on file  Food Insecurity: No Food Insecurity (03/29/2023)   Hunger Vital Sign    Worried About Running Out of Food in the Last Year: Never true    Ran Out of Food in the Last Year: Never true  Transportation Needs: No Transportation Needs (03/29/2023)   PRAPARE - Administrator, Civil Service (Medical): No    Lack of Transportation (Non-Medical): No  Physical Activity: Not on file  Stress: Not on file  Social Connections: Not on file  Intimate Partner Violence: Not At Risk (03/29/2023)   Humiliation, Afraid, Rape,  and Kick questionnaire    Fear of Current or Ex-Partner: No    Emotionally Abused: No    Physically Abused: No    Sexually Abused: No    Family History  Problem Relation Age of Onset   Anemia Mother    Diabetes Mother    Leukemia Mother    Heart disease Father    Diabetes Father       Review of systems complete and found to be negative unless listed above      PHYSICAL EXAM  Did not examine patient as she is not in room  Labs:   Lab Results  Component Value Date   WBC 8.9 03/30/2023   HGB 10.9 (L) 03/30/2023   HCT 31.3 (L) 03/30/2023   MCV 85.5 03/30/2023   PLT 206 03/30/2023    Recent Labs  Lab 03/28/23 1508 03/28/23 2042 03/30/23 0422  NA 125*   < > 136  K 4.0   < > 3.5  CL 92*   < > 106  CO2 19*   < > 21*  BUN 22   < > 15  CREATININE 1.26*   < > 0.89  CALCIUM 8.2*   < > 8.3*  PROT 7.6  --   --   BILITOT 1.1  --   --   ALKPHOS 59  --   --   ALT 19  --   --   AST 39  --   --   GLUCOSE 493*   < > 139*   < > = values in this interval not displayed.   Lab Results  Component Value Date   TROPONINI <0.03 01/31/2017    Lab Results  Component Value Date   CHOL 298 (H) 03/29/2023   CHOL 341 (H) 10/24/2013   Lab Results  Component Value Date   HDL 41 03/29/2023   HDL 33 (L) 10/24/2013   Lab Results  Component Value Date   LDLCALC UNABLE TO CALCULATE IF TRIGLYCERIDE OVER 400 mg/dL 40/98/1191   LDLCALC Comment 10/24/2013   Lab Results  Component Value Date   TRIG 402 (H) 03/29/2023   TRIG 949 (HH) 10/24/2013   Lab Results  Component Value Date   CHOLHDL 7.3 03/29/2023   CHOLHDL 10.3 (H) 10/24/2013   Lab Results  Component Value Date   LDLDIRECT 187 (H) 03/29/2023      Radiology: ECHOCARDIOGRAM COMPLETE  Result Date: 03/29/2023    ECHOCARDIOGRAM REPORT   Patient Name:   Andrea [name] Date of Exam: 03/29/2023 Medical Rec #:  [id number]      Height:       60.0 in Accession #:    [id number]  Weight:       157.4 lb Date of Birth:   1955/05/24      BSA:          1.686 m Patient Age:    67 years       BP:           136/71 mmHg Patient Gender: F              HR:           100 bpm. Exam Location:  [facility] Procedure: 2D Echo, Cardiac Doppler and Color Doppler Indications:     NSTEMI I21.4  History:         Patient has no prior history of Echocardiogram examinations.                  Risk Factors:Hypertension and Dyslipidemia. Tobacco use.  Sonographer:     Cristela Blue Referring Phys:  [id number] CARALYN HUDSON Diagnosing Phys: Mellody Drown Mahkai Fangman IMPRESSIONS  1. Left ventricular ejection fraction, by estimation, is 30 to 35%. The left ventricle has severely decreased function. The left ventricle demonstrates regional wall motion abnormalities (see scoring diagram/findings for description). Left ventricular diastolic parameters are consistent with Grade II diastolic dysfunction (pseudonormalization).  2. Right ventricular systolic function is normal. The right ventricular size is normal.  3. Moderate mitral valve regurgitation.  4. The aortic valve is normal in structure. Aortic valve regurgitation is not visualized. No aortic stenosis is present.  5. The inferior vena cava is normal in size with greater than 50% respiratory variability, suggesting right atrial pressure of 3 mmHg. FINDINGS  Left Ventricle: Left ventricular ejection fraction, by estimation, is 30 to 35%. The left ventricle has severely decreased function. The left ventricle demonstrates regional wall motion abnormalities. The left ventricular internal cavity size was normal  in size. There is no left ventricular hypertrophy. Left ventricular diastolic parameters are consistent with Grade II diastolic dysfunction (pseudonormalization).  LV Wall Scoring: The entire apex is akinetic. The mid anteroseptal segment, mid inferolateral segment, mid anterolateral segment, mid inferoseptal segment, mid anterior segment, and mid inferior segment are hypokinetic. The basal anteroseptal segment, basal  inferolateral segment, basal anterolateral segment, basal anterior segment, basal inferior segment, and basal inferoseptal segment are normal. Right Ventricle: The right ventricular size is normal. No increase in right ventricular wall thickness. Right ventricular systolic function is normal. Left Atrium: Left atrial size was normal in size. Right Atrium: Right atrial size was normal in size. Pericardium: There is no evidence of pericardial effusion. Mitral Valve: There is mild thickening of the mitral valve leaflet(s). Moderate mitral valve regurgitation. MV peak gradient, 8.3 mmHg. The mean mitral valve gradient is 3.0 mmHg. Tricuspid Valve: The tricuspid valve is not well visualized. Tricuspid valve regurgitation is trivial. Aortic Valve: The aortic valve is normal in structure. Aortic valve regurgitation is not visualized. No aortic stenosis is present. Aortic valve mean gradient measures 4.0 mmHg. Aortic valve peak gradient measures 6.6 mmHg. Aortic valve area, by VTI measures 1.28 cm. Pulmonic Valve: The pulmonic valve was not well visualized. Pulmonic valve regurgitation is trivial. Aorta: The aortic root is normal in size and structure. Venous: The inferior vena cava is normal in size with greater than 50% respiratory variability, suggesting right atrial pressure of 3 mmHg. IAS/Shunts: The interatrial septum was not assessed.  LEFT VENTRICLE PLAX 2D LVIDd:         3.60 cm     Diastology LVIDs:         2.80  cm     LV e' medial:    6.31 cm/s LV PW:         0.80 cm     LV E/e' medial:  14.9 LV IVS:        1.20 cm     LV e' lateral:   5.77 cm/s LVOT diam:     2.00 cm     LV E/e' lateral: 16.3 LV SV:         32 LV SV Index:   19 LVOT Area:     3.14 cm  LV Volumes (MOD) LV vol d, MOD A2C: 56.4 ml LV vol d, MOD A4C: 82.9 ml LV vol s, MOD A2C: 41.0 ml LV vol s, MOD A4C: 53.4 ml LV SV MOD A2C:     15.4 ml LV SV MOD A4C:     82.9 ml LV SV MOD BP:      22.1 ml RIGHT VENTRICLE RV S prime:     13.60 cm/s TAPSE  (M-mode): 2.0 cm LEFT ATRIUM             Index        RIGHT ATRIUM           Index LA diam:        3.00 cm 1.78 cm/m   RA Area:     10.50 cm LA Vol (A2C):   9.6 ml  5.72 ml/m   RA Volume:   25.00 ml  14.83 ml/m LA Vol (A4C):   21.4 ml 12.69 ml/m LA Biplane Vol: 16.0 ml 9.49 ml/m  AORTIC VALVE AV Area (Vmax):    1.23 cm AV Area (Vmean):   1.13 cm AV Area (VTI):     1.28 cm AV Vmax:           128.33 cm/s AV Vmean:          90.600 cm/s AV VTI:            0.253 m AV Peak Grad:      6.6 mmHg AV Mean Grad:      4.0 mmHg LVOT Vmax:         50.30 cm/s LVOT Vmean:        32.700 cm/s LVOT VTI:          0.103 m LVOT/AV VTI ratio: 0.41  AORTA Ao Root diam: 2.00 cm MITRAL VALVE                TRICUSPID VALVE MV Area (PHT): 5.50 cm     TR Peak grad:   33.4 mmHg MV Area VTI:   1.26 cm     TR Vmax:        289.00 cm/s MV Peak grad:  8.3 mmHg MV Mean grad:  3.0 mmHg     SHUNTS MV Vmax:       1.44 m/s     Systemic VTI:  0.10 m MV Vmean:      84.4 cm/s    Systemic Diam: 2.00 cm MV Decel Time: 138 msec MV E velocity: 94.30 cm/s MV A velocity: 112.00 cm/s MV E/A ratio:  0.84 Windell Norfolk Electronically signed by Windell Norfolk Signature Date/Time: 03/29/2023/2:58:21 PM    Final    DG Chest 2 View  Result Date: 03/28/2023 CLINICAL DATA:  Chest pain EXAM: CHEST - 2 VIEW COMPARISON:  04/24/2012 FINDINGS: The heart size and mediastinal contours are within normal limits. Aortic atherosclerosis. Both lungs are clear. The visualized skeletal structures are unremarkable. IMPRESSION: No  active cardiopulmonary disease. Electronically Signed   By: Duanne Guess D.O.   On: 03/28/2023 17:36    ASSESSMENT AND PLAN:  NSTEMI type I Ischemic cardiomyopathy, LVEF 30 to 35% with wall motion abnormalities Hypertension Hyperlipidemia, diabetes, tobacco use  Plan for left heart catheterization today with possible PCI. Continue aspirin, heparin drip. She will need goal-directed medical therapy for cardiomyopathy.  After cath,  change atenolol to Toprol-XL 50 mg daily.  Based on renal function/blood pressure, also consider restarting losartan from tomorrow. Did not tolerate statin before.  Continue Zetia.  consider PCSK9 inhibitor as outpatient  Signed: Kathryne Gin MD,PhD, [facility] 03/30/2023, 11:19 AM

## 2023-03-30 NOTE — Interval H&P Note (Signed)
History and Physical Interval Note:  03/30/2023 10:55 AM  Andrea [name]  has presented today for surgery, with the diagnosis of non-ST elevation myocardial infarction.  The various methods of treatment have been discussed with the patient and family. After consideration of risks, benefits and other options for treatment, the patient has consented to  Procedure(s): LEFT HEART CATH AND CORONARY ANGIOGRAPHY (N/A) as a surgical intervention.  The patient's history has been reviewed, patient examined, no change in status, stable for surgery.  I have reviewed the patient's chart and labs.  Questions were answered to the patient's satisfaction.     Lorine Bears

## 2023-03-30 NOTE — Consult Note (Signed)
Pharmacy Consult Note - Anticoagulation  Pharmacy Consult for heparin Indication: chest pain/ACS  PATIENT MEASUREMENTS: Height: 5' (152.4 cm) Weight: 71.2 kg (157 lb) IBW/kg (Calculated) : 45.5 HEPARIN DW (KG): 63.6  VITAL SIGNS: Temp: 98.3 F (36.8 C) (11/15 0327) BP: 113/77 (11/15 0327) Pulse Rate: 93 (11/15 0327)  Recent Labs    03/28/23 1642 03/28/23 2042 03/29/23 1130 03/29/23 1355 03/29/23 1954 03/30/23 0422  HGB  --    < >  --   --   --  10.9*  HCT  --    < >  --   --   --  31.3*  PLT  --    < >  --   --   --  206  APTT 20*  --  61*  --    < > 86*  LABPROT 13.8  --   --   --   --   --   INR 1.0  --   --   --   --   --   HEPARINUNFRC  --    < > 0.51  --   --   --   CREATININE  --    < >  --   --   --  0.89  TROPONINIHS 5,020*   < >  --  9,472*  --   --    < > = values in this interval not displayed.    Estimated Creatinine Clearance: 55.5 mL/min (by C-G formula based on SCr of 0.89 mg/dL).  PAST MEDICAL HISTORY: Past Medical History:  Diagnosis Date   Arthritis of hip    Chest pain    a. 11/2011 Lexi MV: No ischemia/infarct, EF 46%.   Complication of anesthesia    nausea/vomiting   Depression    HTN (hypertension)    Hyperlipidemia    Migraines    PONV (postoperative nausea and vomiting)    Tachycardia    Tobacco abuse    Type II or unspecified type diabetes mellitus without mention of complication, not stated as uncontrolled    Unspecified congenital anomaly of eye     ASSESSMENT: 67 y.o. female with PMH including HTN, HLD, DM, smoking is presenting with left center chest pain with vomiting.  Patient is not on chronic anticoagulation per chart review. Pharmacy has been consulted to initiate and manage heparin intravenous infusion.  Pertinent medications: No chronic AC prior to admission per chart review  Goal(s) of therapy: Heparin level 0.3 - 0.7 units/mL Monitor platelets by anticoagulation protocol: Yes   Baseline anticoagulation  labs: Recent Labs    03/28/23 1508 03/28/23 1642 03/28/23 1642 03/29/23 0154 03/29/23 1130 03/29/23 1954 03/30/23 0422  APTT  --  20*   < >  --  61* 73* 86*  INR  --  1.0  --   --   --   --   --   HGB 12.8  --   --  11.7*  --   --  10.9*  PLT 255  --   --  233  --   --  206   < > = values in this interval not displayed.      PLAN: aPTT therapeutic x 2 continue heparin infusion at 1050 units/hour  recheck aPTT level daily while therapeutic Patient has TG > 400; manage heparin by aPTT  Monitor CBC daily while on heparin infusion.  Otelia Sergeant, PharmD, [facility] 03/30/2023 6:11 AM

## 2023-03-31 DIAGNOSIS — I214 Non-ST elevation (NSTEMI) myocardial infarction: Secondary | ICD-10-CM

## 2023-03-31 DIAGNOSIS — E039 Hypothyroidism, unspecified: Secondary | ICD-10-CM | POA: Insufficient documentation

## 2023-03-31 DIAGNOSIS — N1831 Chronic kidney disease, stage 3a: Secondary | ICD-10-CM | POA: Diagnosis not present

## 2023-03-31 DIAGNOSIS — Z794 Long term (current) use of insulin: Secondary | ICD-10-CM | POA: Diagnosis not present

## 2023-03-31 DIAGNOSIS — E1165 Type 2 diabetes mellitus with hyperglycemia: Secondary | ICD-10-CM | POA: Diagnosis not present

## 2023-03-31 LAB — CBC
HCT: 32.1 % — ABNORMAL LOW (ref 36.0–46.0)
Hemoglobin: 10.9 g/dL — ABNORMAL LOW (ref 12.0–15.0)
MCH: 29.5 pg (ref 26.0–34.0)
MCHC: 34 g/dL (ref 30.0–36.0)
MCV: 87 fL (ref 80.0–100.0)
Platelets: 201 10*3/uL (ref 150–400)
RBC: 3.69 MIL/uL — ABNORMAL LOW (ref 3.87–5.11)
RDW: 13 % (ref 11.5–15.5)
WBC: 7.7 10*3/uL (ref 4.0–10.5)
nRBC: 0 % (ref 0.0–0.2)

## 2023-03-31 LAB — BASIC METABOLIC PANEL
Anion gap: 8 (ref 5–15)
BUN: 12 mg/dL (ref 8–23)
CO2: 24 mmol/L (ref 22–32)
Calcium: 8.3 mg/dL — ABNORMAL LOW (ref 8.9–10.3)
Chloride: 104 mmol/L (ref 98–111)
Creatinine, Ser: 1.06 mg/dL — ABNORMAL HIGH (ref 0.44–1.00)
GFR, Estimated: 58 mL/min — ABNORMAL LOW (ref >60)
Glucose, Bld: 186 mg/dL — ABNORMAL HIGH (ref 70–99)
Potassium: 4.4 mmol/L (ref 3.5–5.1)
Sodium: 136 mmol/L (ref 135–145)

## 2023-03-31 LAB — MAGNESIUM: Magnesium: 2.9 mg/dL — ABNORMAL HIGH (ref 1.7–2.4)

## 2023-03-31 LAB — GLUCOSE, CAPILLARY
Glucose-Capillary: 162 mg/dL — ABNORMAL HIGH (ref 70–99)
Glucose-Capillary: 302 mg/dL — ABNORMAL HIGH (ref 70–99)

## 2023-03-31 MED ORDER — NICOTINE 21 MG/24HR TD PT24: 21.0000 mg | MEDICATED_PATCH | Freq: Every day | TRANSDERMAL | 0 refills | Status: AC

## 2023-03-31 MED ORDER — LEVOTHYROXINE SODIUM 50 MCG PO TABS
50.0000 ug | ORAL_TABLET | Freq: Every day | ORAL | Status: DC
  Administered 2023-03-31: 50 ug via ORAL
  Filled 2023-03-31: qty 1

## 2023-03-31 MED ORDER — LEVOTHYROXINE SODIUM 50 MCG PO TABS: 50.0000 ug | ORAL_TABLET | Freq: Every day | ORAL | 0 refills | Status: AC

## 2023-03-31 NOTE — Progress Notes (Signed)
SUBJECTIVE: Patient is feeling much better denies any chest pain or shortness of breath at this time.   Vitals:   03/30/23 1934 03/31/23 0019 03/31/23 0451 03/31/23 0734  BP: (!) 107/59 120/69 119/71 132/76  Pulse: 100 95 94 90  Resp: 19 18 12 18   Temp: 99.2 F (37.3 C) 98.3 F (36.8 C) 98.6 F (37 C) 97.8 F (36.6 C)  TempSrc: Oral Oral Oral   SpO2: 100% 98% 100% 100%  Weight:      Height:        Intake/Output Summary (Last 24 hours) at 03/31/2023 1120 Last data filed at 03/31/2023 0500 Gross per 24 hour  Intake 740 ml  Output --  Net 740 ml    LABS: Basic Metabolic Panel: Recent Labs    03/28/23 2042 03/29/23 0154 03/30/23 2049 03/31/23 0447  NA 132*   < > 130* 136  K 4.4   < > 3.8 4.4  CL 98   < > 101 104  CO2 23   < > 23 24  GLUCOSE 349*   < > 245* 186*  BUN 25*   < > 16 12  CREATININE 1.31*   < > 1.15* 1.06*  CALCIUM 8.4*   < > 7.8* 8.3*  MG 1.8   < > 1.5* 2.9*  PHOS 2.6  --   --   --    < > = values in this interval not displayed.   Liver Function Tests: Recent Labs    03/28/23 1508  AST 39  ALT 19  ALKPHOS 59  BILITOT 1.1  PROT 7.6  ALBUMIN 3.8   No results for input(s): "LIPASE", "AMYLASE" in the last 72 hours. CBC: Recent Labs    03/30/23 0422 03/31/23 0447  WBC 8.9 7.7  HGB 10.9* 10.9*  HCT 31.3* 32.1*  MCV 85.5 87.0  PLT 206 201   Cardiac Enzymes: No results for input(s): "CKTOTAL", "CKMB", "CKMBINDEX", "TROPONINI" in the last 72 hours. BNP: Invalid input(s): "POCBNP" D-Dimer: No results for input(s): "DDIMER" in the last 72 hours. Hemoglobin A1C: Recent Labs    03/28/23 2042  HGBA1C 11.1*   Fasting Lipid Panel: Recent Labs    03/29/23 0154  CHOL 298*  HDL 41  LDLCALC UNABLE TO CALCULATE IF TRIGLYCERIDE OVER 400 mg/dL  TRIG 161*  CHOLHDL 7.3  LDLDIRECT 187*   Thyroid Function Tests: No results for input(s): "TSH", "T4TOTAL", "T3FREE", "THYROIDAB" in the last 72 hours.  Invalid input(s): "FREET3" Anemia  Panel: No results for input(s): "VITAMINB12", "FOLATE", "FERRITIN", "TIBC", "IRON", "RETICCTPCT" in the last 72 hours.   PHYSICAL EXAM General: Well developed, well nourished, in no acute distress HEENT:  Normocephalic and atramatic Neck:  No JVD.  Lungs: Clear bilaterally to auscultation and percussion. Heart: HRRR . Normal S1 and S2 without gallops or murmurs.  Abdomen: Bowel sounds are positive, abdomen soft and non-tender  Msk:  Back normal, normal gait. Normal strength and tone for age. Extremities: No clubbing, cyanosis or edema.   Neuro: Alert and oriented X 3. Psych:  Good affect, responds appropriately  TELEMETRY: Normal sinus rhythm about 80 bpm with occasional dropped beats  ASSESSMENT AND PLAN: Non-STEMI with peak troponin 15,000.  Patient denies any chest pain at this time and is being treated medically.  Had dropped beats on the monitor most likely PAC and will do ZIO as an outpatient.  Cardiac catheterization revealed 50% mid LAD 60% proximal first diagonal and 70% second diagonal with occluded mid to distal LAD.  There  were collaterals from distal second diagonal with distal LAD.  RCA in the midportion had 60% disease.  It was decided to treat the patient medically.  Patient is currently chest pain-free and will be followed up as an outpatient with [facility] cardiology.   ICD-10-CM   1. NSTEMI (non-ST elevated myocardial infarction) (HCC)  I21.4       Principal Problem:   NSTEMI (non-ST elevated myocardial infarction) (HCC) Active Problems:   Hyperlipidemia   Hypertension   Smoking   Type II diabetes mellitus with renal manifestations (HCC)   Chronic kidney disease, stage 3a (HCC)   Obesity (BMI 30-39.9)   Hyponatremia   Depression   Uncontrolled type 2 diabetes mellitus with hyperglycemia, with long-term current use of insulin (HCC)   Hypothyroidism    Adrian Blackwater, MD, [facility] 03/31/2023 11:20 AM

## 2023-03-31 NOTE — Discharge Summary (Signed)
Physician Discharge Summary   [name] MRN: [id number] DOB: [birth year]  Admit date:     03/28/2023  Discharge date: 03/31/23  Discharge Physician: Marrion Coy   PCP: Pcp, No   Recommendations at discharge:   Follow-up with cardiology in 1 week. Cardiology will set up ZIO recorder.  Discharge Diagnoses: Principal Problem:   NSTEMI (non-ST elevated myocardial infarction) (HCC) Active Problems:   Hyponatremia   Hyperlipidemia   Hypertension   Type II diabetes mellitus with renal manifestations (HCC)   Chronic kidney disease, stage 3a (HCC)   Smoking   Obesity (BMI 30-39.9)   Depression   Uncontrolled type 2 diabetes mellitus with hyperglycemia, with long-term current use of insulin (HCC)   Hypothyroidism  Resolved Problems:   * No resolved hospital problems. *  Hospital Course: Andrea [name] is a 67 y.o. female with medical history significant of HTN, HL:D, DM, depression, CKD-3a, tobacco abuse, obesity, SVT, migraine headache, panic attacks, who presents with chest pain.  Troponin peak at 15,281, EKG did not show any ST elevation.  Cardiology consult obtained.  Patient placed on heparin drip, aspirin and beta blocker.  Patient could not tolerate the statin. Chronic angiogram was performed on 11/15, showed severe one-vessel disease with mid LAD.  Decision was made to treat medically. Patient was monitored overnight after heart cath.  Renal function still stable.  Telemetry showed second-degree AV block with infrequent dropping obese.  Discussed with cardiology, Dr. Welton Flakes, she will try to set up a ZIO recorder as outpatient.  Medically stable for discharge.  Assessment and Plan: NSTEMI (non-ST elevated myocardial infarction) [facility]): Dyslipidemia. Peak troponin over 15,000, no ST-T elevation.  Seen by cardiology, continued on heparin and nitroglycerin drip.  Continue aspirin beta-blocker. Patient also had significant dyslipidemia, cannot tolerate statin, added  Zetia. Heart cath was performed today, showed severe one-vessel disease in LAD.  Decision made to treat medically.  Patient is monitored overnight postprocedure.  Discussed with cardiology, okay to discharge from their standpoint.  Morbid type II AV block. Telemetry overnight had infrequent drop of beats, it is consistent with Mobitz type II AV block.  But only a few drops.  Discussed with Dr. Welton Flakes, no need for emergent procedure.  He will try to set up a ZIO recorder as outpatient.  Follow-up with cardiology as outpatient.     Hypertension Medication adjusted.     Type II diabetes mellitus with renal manifestations (HCC) Uncontrolled type 2 diabetes with hyperglycemia.:  Hemoglobin A1c 11.1.  Will need insulin at discharge.  Will start insulin glargine was increased dose of 25 units, scheduled NovoLog and sliding scale insulin.  Patient will need insulin injection at discharge, insulin prescription and supplies sent to the pharmacy.       Chronic kidney disease, stage 3a (HCC): Hyponatremia:  Hypomagnesemia. Mild metabolic acidosis resolved. Renal function stable, sodium level better.  Magnesium normalized.   Smoking -Nicotine patch -Counseled about importance of quitting smoking   Obesity (BMI 30-39.9): Body weight 71.4 kg, BMI 30.74 -Encourage losing weight -Exercise and healthy diet   Depression -Lexapro        Consultants: Cardiology. Procedures performed: Heart cath. Disposition: Home Diet recommendation:  Discharge Diet Orders (From admission, onward)     Start     Ordered   03/31/23 0000  Diet - low sodium heart healthy        03/31/23 1028           Cardiac diet DISCHARGE MEDICATION: Allergies as of  03/31/2023       Reactions   Enalapril    Muscle and joint pain   Kiwi Extract    Makes mouth numb   Other    Muscadine fruit makes mouth numb   Percocet [oxycodone-acetaminophen] Nausea And Vomiting   Pineapple    Makes mouth numb   Statins  Other (See Comments)   Leg cramps   Latex Rash   If wears them         Medication List     STOP taking these medications    atenolol 25 MG tablet Commonly known as: TENORMIN   lisinopril-hydrochlorothiazide 10-12.5 MG tablet Commonly known as: ZESTORETIC   metFORMIN 500 MG 24 hr tablet Commonly known as: GLUCOPHAGE-XR   ondansetron 4 MG disintegrating tablet Commonly known as: Zofran ODT       TAKE these medications    aspirin EC 81 MG tablet Take 81 mg by mouth daily. Swallow whole.   Blood Glucose Monitoring Suppl Devi 1 each by Does not apply route 3 (three) times daily. May dispense any manufacturer covered by patient's insurance.   BLOOD GLUCOSE TEST STRIPS Strp 1 each by Does not apply route 3 (three) times daily. Use as directed to check blood sugar. May dispense any manufacturer covered by patient's insurance and fits patient's device.   carvedilol 3.125 MG tablet Commonly known as: COREG Take 1 tablet (3.125 mg total) by mouth 2 (two) times daily with a meal.   clopidogrel 75 MG tablet Commonly known as: PLAVIX Take 1 tablet (75 mg total) by mouth daily with breakfast.   escitalopram 20 MG tablet Commonly known as: LEXAPRO Take 20 mg by mouth daily.   ESTROVEN PO Take 1 tablet by mouth at bedtime. Supplement for hot flashes   ezetimibe 10 MG tablet Commonly known as: ZETIA Take 1 tablet (10 mg total) by mouth daily.   insulin glargine 100 UNIT/ML Solostar Pen Commonly known as: LANTUS Inject 25 Units into the skin daily. May substitute as needed per insurance.   Jardiance 10 MG Tabs tablet Generic drug: empagliflozin Take 10 mg by mouth daily.   Lancet Device Misc 1 each by Does not apply route 3 (three) times daily. May dispense any manufacturer covered by patient's insurance.   Lancets Misc 1 each by Does not apply route 3 (three) times daily. Use as directed to check blood sugar. May dispense any manufacturer covered by patient's  insurance and fits patient's device.   levothyroxine 50 MCG tablet Commonly known as: SYNTHROID Take 1 tablet (50 mcg total) by mouth daily at 6 (six) AM. Start taking on: April 01, 2023   NovoLIN R FlexPen ReliOn 100 UNIT/ML FlexPen Generic drug: Insulin Regular Human Inject 8 Units into the skin 3 (three) times daily with meals. Only take if eating a meal AND Blood Glucose (BG) is 80 or higher.   Pen Needles 31G X 5 MM Misc 1 each by Does not apply route 3 (three) times daily. May dispense any manufacturer covered by patient's insurance.        Follow-up Information     Alluri, Meryl Dare, MD. Go in 1 week(s).   Specialty: Cardiology Contact information: [address] [place] [address] [phone]                Discharge Exam: Ceasar Mons Weights   03/28/23 1647 03/28/23 2204 03/30/23 0500  Weight: 72.6 kg 71.4 kg 71.2 kg   General exam: Appears calm and comfortable  Respiratory system:  Clear to auscultation. Respiratory effort normal. Cardiovascular system: S1 & S2 heard, RRR. No JVD, murmurs, rubs, gallops or clicks. No pedal edema. Gastrointestinal system: Abdomen is nondistended, soft and nontender. No organomegaly or masses felt. Normal bowel sounds heard. Central nervous system: Alert and oriented. No focal neurological deficits. Extremities: Symmetric 5 x 5 power. Skin: No rashes, lesions or ulcers Psychiatry: Judgement and insight appear normal. Mood & affect appropriate.    Condition at discharge: good  The results of significant diagnostics from this hospitalization (including imaging, microbiology, ancillary and laboratory) are listed below for reference.   Imaging Studies: CARDIAC CATHETERIZATION  Result Date: 03/30/2023   1st Diag lesion is 60% stenosed.   2nd Diag lesion is 70% stenosed.   Mid LAD lesion is 100% stenosed.   Prox LAD to Mid LAD lesion is 50% stenosed.   Prox RCA to Mid RCA lesion is 60% stenosed. 1.  Small caliber  coronary arteries with severe one-vessel coronary artery disease.  Occluded mid LAD after second diagonal with faint left to left collaterals.  The RCA has moderate diffuse disease in the proximal/mid segment and is moderately calcified. 2.  Left ventricular angiography was not performed.  EF was 30 to 35% by echo with akinetic mid to distal anterior and apical myocardium. 3.  Moderately elevated left ventricular end-diastolic pressure at 22 mmHg. Recommendations: Based on the patient's symptoms and troponin trend, the LAD occlusion is more than 48 hours ago.  The echocardiogram showed akinesis and I suspect that this territory is not viable at this point. She is currently not having recurrent angina.  I recommend medical therapy. I switched atenolol to carvedilol. Will start the patient on clopidogrel. Consider alternative treatment for hyperlipidemia given intolerance to statins.   ECHOCARDIOGRAM COMPLETE  Result Date: 03/29/2023    ECHOCARDIOGRAM REPORT   Patient Name:   Veleda D [name] Date of Exam: 03/29/2023 Medical Rec #:  [id number]      Height:       60.0 in Accession #:    [id number]     Weight:       157.4 lb Date of Birth:  04/14/1956      BSA:          1.686 m Patient Age:    67 years       BP:           136/71 mmHg Patient Gender: F              HR:           100 bpm. Exam Location:  [facility] Procedure: 2D Echo, Cardiac Doppler and Color Doppler Indications:     NSTEMI I21.4  History:         Patient has no prior history of Echocardiogram examinations.                  Risk Factors:Hypertension and Dyslipidemia. Tobacco use.  Sonographer:     Cristela Blue Referring Phys:  [id number] CARALYN HUDSON Diagnosing Phys: Mellody Drown Alluri IMPRESSIONS  1. Left ventricular ejection fraction, by estimation, is 30 to 35%. The left ventricle has severely decreased function. The left ventricle demonstrates regional wall motion abnormalities (see scoring diagram/findings for description). Left ventricular diastolic  parameters are consistent with Grade II diastolic dysfunction (pseudonormalization).  2. Right ventricular systolic function is normal. The right ventricular size is normal.  3. Moderate mitral valve regurgitation.  4. The aortic valve is normal in structure. Aortic valve regurgitation is not visualized. No  aortic stenosis is present.  5. The inferior vena cava is normal in size with greater than 50% respiratory variability, suggesting right atrial pressure of 3 mmHg. FINDINGS  Left Ventricle: Left ventricular ejection fraction, by estimation, is 30 to 35%. The left ventricle has severely decreased function. The left ventricle demonstrates regional wall motion abnormalities. The left ventricular internal cavity size was normal  in size. There is no left ventricular hypertrophy. Left ventricular diastolic parameters are consistent with Grade II diastolic dysfunction (pseudonormalization).  LV Wall Scoring: The entire apex is akinetic. The mid anteroseptal segment, mid inferolateral segment, mid anterolateral segment, mid inferoseptal segment, mid anterior segment, and mid inferior segment are hypokinetic. The basal anteroseptal segment, basal inferolateral segment, basal anterolateral segment, basal anterior segment, basal inferior segment, and basal inferoseptal segment are normal. Right Ventricle: The right ventricular size is normal. No increase in right ventricular wall thickness. Right ventricular systolic function is normal. Left Atrium: Left atrial size was normal in size. Right Atrium: Right atrial size was normal in size. Pericardium: There is no evidence of pericardial effusion. Mitral Valve: There is mild thickening of the mitral valve leaflet(s). Moderate mitral valve regurgitation. MV peak gradient, 8.3 mmHg. The mean mitral valve gradient is 3.0 mmHg. Tricuspid Valve: The tricuspid valve is not well visualized. Tricuspid valve regurgitation is trivial. Aortic Valve: The aortic valve is normal in  structure. Aortic valve regurgitation is not visualized. No aortic stenosis is present. Aortic valve mean gradient measures 4.0 mmHg. Aortic valve peak gradient measures 6.6 mmHg. Aortic valve area, by VTI measures 1.28 cm. Pulmonic Valve: The pulmonic valve was not well visualized. Pulmonic valve regurgitation is trivial. Aorta: The aortic root is normal in size and structure. Venous: The inferior vena cava is normal in size with greater than 50% respiratory variability, suggesting right atrial pressure of 3 mmHg. IAS/Shunts: The interatrial septum was not assessed.  LEFT VENTRICLE PLAX 2D LVIDd:         3.60 cm     Diastology LVIDs:         2.80 cm     LV e' medial:    6.31 cm/s LV PW:         0.80 cm     LV E/e' medial:  14.9 LV IVS:        1.20 cm     LV e' lateral:   5.77 cm/s LVOT diam:     2.00 cm     LV E/e' lateral: 16.3 LV SV:         32 LV SV Index:   19 LVOT Area:     3.14 cm  LV Volumes (MOD) LV vol d, MOD A2C: 56.4 ml LV vol d, MOD A4C: 82.9 ml LV vol s, MOD A2C: 41.0 ml LV vol s, MOD A4C: 53.4 ml LV SV MOD A2C:     15.4 ml LV SV MOD A4C:     82.9 ml LV SV MOD BP:      22.1 ml RIGHT VENTRICLE RV S prime:     13.60 cm/s TAPSE (M-mode): 2.0 cm LEFT ATRIUM             Index        RIGHT ATRIUM           Index LA diam:        3.00 cm 1.78 cm/m   RA Area:     10.50 cm LA Vol (A2C):   9.6 ml  5.72 ml/m   RA  Volume:   25.00 ml  14.83 ml/m LA Vol (A4C):   21.4 ml 12.69 ml/m LA Biplane Vol: 16.0 ml 9.49 ml/m  AORTIC VALVE AV Area (Vmax):    1.23 cm AV Area (Vmean):   1.13 cm AV Area (VTI):     1.28 cm AV Vmax:           128.33 cm/s AV Vmean:          90.600 cm/s AV VTI:            0.253 m AV Peak Grad:      6.6 mmHg AV Mean Grad:      4.0 mmHg LVOT Vmax:         50.30 cm/s LVOT Vmean:        32.700 cm/s LVOT VTI:          0.103 m LVOT/AV VTI ratio: 0.41  AORTA Ao Root diam: 2.00 cm MITRAL VALVE                TRICUSPID VALVE MV Area (PHT): 5.50 cm     TR Peak grad:   33.4 mmHg MV Area VTI:   1.26  cm     TR Vmax:        289.00 cm/s MV Peak grad:  8.3 mmHg MV Mean grad:  3.0 mmHg     SHUNTS MV Vmax:       1.44 m/s     Systemic VTI:  0.10 m MV Vmean:      84.4 cm/s    Systemic Diam: 2.00 cm MV Decel Time: 138 msec MV E velocity: 94.30 cm/s MV A velocity: 112.00 cm/s MV E/A ratio:  0.84 Windell Norfolk Electronically signed by Windell Norfolk Signature Date/Time: 03/29/2023/2:58:21 PM    Final    DG Chest 2 View  Result Date: 03/28/2023 CLINICAL DATA:  Chest pain EXAM: CHEST - 2 VIEW COMPARISON:  04/24/2012 FINDINGS: The heart size and mediastinal contours are within normal limits. Aortic atherosclerosis. Both lungs are clear. The visualized skeletal structures are unremarkable. IMPRESSION: No active cardiopulmonary disease. Electronically Signed   By: Duanne Guess D.O.   On: 03/28/2023 17:36    Microbiology: No results found for this or any previous visit.  Labs: CBC: Recent Labs  Lab 03/28/23 1508 03/29/23 0154 03/30/23 0422 03/31/23 0447  WBC 9.9 9.3 8.9 7.7  HGB 12.8 11.7* 10.9* 10.9*  HCT 37.0 33.2* 31.3* 32.1*  MCV 86.9 83.4 85.5 87.0  PLT 255 233 206 201   Basic Metabolic Panel: Recent Labs  Lab 03/28/23 2042 03/29/23 0154 03/29/23 0738 03/30/23 0422 03/30/23 2049 03/31/23 0447  NA 132* 133* 134* 136 130* 136  K 4.4 4.1 3.8 3.5 3.8 4.4  CL 98 101 101 106 101 104  CO2 23 22 23  21* 23 24  GLUCOSE 349* 323* 283* 139* 245* 186*  BUN 25* 24* 21 15 16 12   CREATININE 1.31* 1.30* 1.12* 0.89 1.15* 1.06*  CALCIUM 8.4* 8.3* 8.3* 8.3* 7.8* 8.3*  MG 1.8  --   --  1.6* 1.5* 2.9*  PHOS 2.6  --   --   --   --   --    Liver Function Tests: Recent Labs  Lab 03/28/23 1508  AST 39  ALT 19  ALKPHOS 59  BILITOT 1.1  PROT 7.6  ALBUMIN 3.8   CBG: Recent Labs  Lab 03/29/23 2054 03/30/23 0838 03/30/23 1347 03/30/23 1600 03/31/23 0727  GLUCAP 248* 168* 333* 418* 162*  Discharge time spent: greater than 30 minutes.  Signed: Marrion Coy, MD [facility] 03/31/2023

## 2023-04-02 LAB — LIPOPROTEIN A (LPA): Lipoprotein (a): 17.4 nmol/L (ref <75.0)

## 2023-05-10 ENCOUNTER — Encounter: Payer: Self-pay | Admitting: *Deleted

## 2023-05-10 ENCOUNTER — Encounter: Payer: Medicare Other | Attending: Cardiology | Admitting: *Deleted

## 2023-05-10 DIAGNOSIS — I214 Non-ST elevation (NSTEMI) myocardial infarction: Secondary | ICD-10-CM

## 2023-05-10 NOTE — Progress Notes (Signed)
Virtual orientation call completed today. shehas an appointment on Date: 05/21/2023  for EP eval and gym Orientation.  Documentation of diagnosis can be found in [facility] Date: 03/28/2023 .

## 2023-05-21 ENCOUNTER — Ambulatory Visit: Payer: Medicare Other

## 2023-05-22 ENCOUNTER — Other Ambulatory Visit: Payer: Self-pay | Admitting: Cardiology

## 2023-05-22 DIAGNOSIS — I2510 Atherosclerotic heart disease of native coronary artery without angina pectoris: Secondary | ICD-10-CM

## 2023-05-22 DIAGNOSIS — I5020 Unspecified systolic (congestive) heart failure: Secondary | ICD-10-CM

## 2023-06-07 ENCOUNTER — Ambulatory Visit
Admission: RE | Admit: 2023-06-07 | Discharge: 2023-06-07 | Disposition: A | Discharge status: Home / Self Care | Payer: Medicare Other | Source: Ambulatory Visit | Attending: Family Medicine | Admitting: Family Medicine

## 2023-06-07 DIAGNOSIS — Z780 Asymptomatic menopausal state: Secondary | ICD-10-CM | POA: Diagnosis present

## 2023-06-07 DIAGNOSIS — Z1231 Encounter for screening mammogram for malignant neoplasm of breast: Secondary | ICD-10-CM | POA: Insufficient documentation

## 2023-06-18 ENCOUNTER — Encounter (HOSPITAL_COMMUNITY): Payer: Self-pay

## 2023-06-20 ENCOUNTER — Ambulatory Visit: Admission: RE | Admit: 2023-06-20 | Payer: Medicare Other | Source: Ambulatory Visit

## 2023-08-29 ENCOUNTER — Other Ambulatory Visit: Payer: Self-pay | Admitting: Cardiology

## 2023-08-29 ENCOUNTER — Ambulatory Visit
Admission: RE | Admit: 2023-08-29 | Discharge: 2023-08-29 | Disposition: A | Discharge status: Home / Self Care | Payer: Medicare Other | Source: Ambulatory Visit | Attending: Cardiology | Admitting: Cardiology

## 2023-08-29 DIAGNOSIS — I2510 Atherosclerotic heart disease of native coronary artery without angina pectoris: Secondary | ICD-10-CM

## 2023-08-29 DIAGNOSIS — I5020 Unspecified systolic (congestive) heart failure: Secondary | ICD-10-CM | POA: Diagnosis not present

## 2023-08-29 MED ORDER — GADOBUTROL 1 MMOL/ML IV SOLN
10.0000 mL | Freq: Once | INTRAVENOUS | Status: AC | PRN
  Administered 2023-08-29: 10 mL via INTRAVENOUS

## 2023-09-07 ENCOUNTER — Other Ambulatory Visit: Payer: Self-pay | Admitting: Emergency Medicine

## 2023-09-07 DIAGNOSIS — Z87891 Personal history of nicotine dependence: Secondary | ICD-10-CM

## 2023-09-07 DIAGNOSIS — Z122 Encounter for screening for malignant neoplasm of respiratory organs: Secondary | ICD-10-CM

## 2023-09-12 ENCOUNTER — Ambulatory Visit
Admission: RE | Admit: 2023-09-12 | Discharge: 2023-09-12 | Disposition: A | Discharge status: Home / Self Care | Source: Ambulatory Visit | Attending: Emergency Medicine | Admitting: Emergency Medicine

## 2023-09-12 DIAGNOSIS — Z122 Encounter for screening for malignant neoplasm of respiratory organs: Secondary | ICD-10-CM | POA: Diagnosis present

## 2023-09-12 DIAGNOSIS — Z87891 Personal history of nicotine dependence: Secondary | ICD-10-CM | POA: Diagnosis present

## 2023-11-22 ENCOUNTER — Ambulatory Visit: Admitting: Dietician

## 2024-05-30 ENCOUNTER — Emergency Department

## 2024-05-30 ENCOUNTER — Emergency Department: Admission: EM | Admit: 2024-05-30 | Discharge: 2024-05-30 | Disposition: A | Discharge status: Home / Self Care

## 2024-05-30 DIAGNOSIS — E119 Type 2 diabetes mellitus without complications: Secondary | ICD-10-CM | POA: Insufficient documentation

## 2024-05-30 DIAGNOSIS — I10 Essential (primary) hypertension: Secondary | ICD-10-CM | POA: Diagnosis not present

## 2024-05-30 DIAGNOSIS — R079 Chest pain, unspecified: Secondary | ICD-10-CM | POA: Insufficient documentation

## 2024-05-30 DIAGNOSIS — D72819 Decreased white blood cell count, unspecified: Secondary | ICD-10-CM | POA: Insufficient documentation

## 2024-05-30 DIAGNOSIS — N3000 Acute cystitis without hematuria: Secondary | ICD-10-CM | POA: Insufficient documentation

## 2024-05-30 LAB — BASIC METABOLIC PANEL WITH GFR
Anion gap: 9 (ref 5–15)
BUN: 11 mg/dL (ref 8–23)
CO2: 24 mmol/L (ref 22–32)
Calcium: 9.1 mg/dL (ref 8.9–10.3)
Chloride: 104 mmol/L (ref 98–111)
Creatinine, Ser: 1.05 mg/dL — ABNORMAL HIGH (ref 0.44–1.00)
GFR, Estimated: 58 mL/min — ABNORMAL LOW
Glucose, Bld: 105 mg/dL — ABNORMAL HIGH (ref 70–99)
Potassium: 4.5 mmol/L (ref 3.5–5.1)
Sodium: 138 mmol/L (ref 135–145)

## 2024-05-30 LAB — URINALYSIS, ROUTINE W REFLEX MICROSCOPIC
Bilirubin Urine: NEGATIVE
Glucose, UA: NEGATIVE mg/dL
Hgb urine dipstick: NEGATIVE
Ketones, ur: NEGATIVE mg/dL
Nitrite: NEGATIVE
Protein, ur: NEGATIVE mg/dL
Specific Gravity, Urine: 1.003 — ABNORMAL LOW (ref 1.005–1.030)
Squamous Epithelial / HPF: 0 /HPF (ref 0–5)
pH: 6 (ref 5.0–8.0)

## 2024-05-30 LAB — CBC WITH DIFFERENTIAL/PLATELET
Abs Immature Granulocytes: 0.03 K/uL (ref 0.00–0.07)
Basophils Absolute: 0.1 K/uL (ref 0.0–0.1)
Basophils Relative: 1 %
Eosinophils Absolute: 0.2 K/uL (ref 0.0–0.5)
Eosinophils Relative: 3 %
HCT: 38.6 % (ref 36.0–46.0)
Hemoglobin: 12.9 g/dL (ref 12.0–15.0)
Immature Granulocytes: 0 %
Lymphocytes Relative: 39 %
Lymphs Abs: 3.5 K/uL (ref 0.7–4.0)
MCH: 29.7 pg (ref 26.0–34.0)
MCHC: 33.4 g/dL (ref 30.0–36.0)
MCV: 88.9 fL (ref 80.0–100.0)
Monocytes Absolute: 0.7 K/uL (ref 0.1–1.0)
Monocytes Relative: 8 %
Neutro Abs: 4.3 K/uL (ref 1.7–7.7)
Neutrophils Relative %: 49 %
Platelets: 242 K/uL (ref 150–400)
RBC: 4.34 MIL/uL (ref 3.87–5.11)
RDW: 12.7 % (ref 11.5–15.5)
WBC: 8.9 K/uL (ref 4.0–10.5)
nRBC: 0 % (ref 0.0–0.2)

## 2024-05-30 LAB — TROPONIN T, HIGH SENSITIVITY
Troponin T High Sensitivity: 19 ng/L (ref 0–19)
Troponin T High Sensitivity: 19 ng/L (ref 0–19)

## 2024-05-30 LAB — CBG MONITORING, ED: Glucose-Capillary: 117 mg/dL — ABNORMAL HIGH (ref 70–99)

## 2024-05-30 MED ORDER — IOHEXOL 350 MG/ML SOLN
75.0000 mL | Freq: Once | INTRAVENOUS | Status: AC | PRN
  Administered 2024-05-30: 75 mL via INTRAVENOUS

## 2024-05-30 MED ORDER — CEPHALEXIN 500 MG PO CAPS: 500.0000 mg | ORAL_CAPSULE | Freq: Three times a day (TID) | ORAL | 0 refills | Status: AC

## 2024-05-30 NOTE — ED Provider Notes (Signed)
 "  [facility] Provider Note    Event Date/Time   First MD Initiated Contact with Patient 05/30/24 1934     (approximate)   History   Chest Pain   HPI  Andrea [name] is a 69 y.o. female with history of hyperlipidemia, hypertension, diabetes, NSTEMI and as listed in EMR presents to the emergency department for treatment and evaluation of midsternal chest pain since last night that radiates into her back. Pain is worse with deep breath and movement. She is on Plavix  and ASA after MI and stent placement in 2024.   Physical Exam    Vitals:   05/30/24 2027 05/30/24 2217  BP: (!) 129/95 (!) 131/96  Pulse: (!) 57   Resp: 18 17  Temp: (!) 97.4 F (36.3 C) 97.8 F (36.6 C)  SpO2: 97% 99%    General: Awake, no distress.  CV:  Good peripheral perfusion.  Resp:  Normal effort. Breath sounds clear. Abd:  No distention.  Other:     ED Results / Procedures / Treatments   Labs (all labs ordered are listed, but only abnormal results are displayed)  Labs Reviewed  BASIC METABOLIC PANEL WITH GFR - Abnormal; Notable for the following components:      Result Value   Glucose, Bld 105 (*)    Creatinine, Ser 1.05 (*)    GFR, Estimated 58 (*)    All other components within normal limits  URINALYSIS, ROUTINE W REFLEX MICROSCOPIC - Abnormal; Notable for the following components:   Color, Urine STRAW (*)    APPearance CLEAR (*)    Specific Gravity, Urine 1.003 (*)    Leukocytes,Ua SMALL (*)    Bacteria, UA RARE (*)    All other components within normal limits  CBG MONITORING, ED - Abnormal; Notable for the following components:   Glucose-Capillary 117 (*)    All other components within normal limits  CBC WITH DIFFERENTIAL/PLATELET  TROPONIN T, HIGH SENSITIVITY  TROPONIN T, HIGH SENSITIVITY     EKG  Sinus rhythm with sinus arrhythmia with a rate of 80.  In comparison to November 2024, T wave inversion is no longer in the anterior  leads   RADIOLOGY  Image and radiology report reviewed and interpreted by me. Radiology report consistent with the same.  Chest x-ray negative for acute cardiopulmonary abnormality.  CTA chest for PE negative for acute findings.  PROCEDURES:  Critical Care performed: No  Procedures   MEDICATIONS ORDERED IN ED:  Medications  iohexol  (OMNIPAQUE ) 350 MG/ML injection 75 mL (75 mLs Intravenous Contrast Given 05/30/24 2050)     IMPRESSION / MDM / ASSESSMENT AND PLAN / ED COURSE   I have reviewed the triage note and vital signs. Vital signs are stable   Differential diagnosis includes, but is not limited to, angina, PE, cardiac event, musculoskeletal pain  Patient's presentation is most consistent with acute presentation with potential threat to life or bodily function.  69 year old female presents to the emergency department for treatment and evaluation of chest pain that radiates into her back and is worse with deep breath and certain movements.  See HPI for further details.  While awaiting ER room assignment, labs were drawn.  BMP shows a GFR of 58 with a BUN of 11 creatinine 1.05.  CBG was 117.  CBC is normal.  Initial troponin is normal as well.  EKG shows no significant changes from previous.  On exam, she does have pain with deep inspiration while auscultating  breath sounds.  Plan will be to get a CTA chest to evaluate for PE.  Patient agreeable to the plan.  Serial troponins are normal.  CTA for PE is negative for acute concerns.  Patient states that her pain is much better than it was last night and earlier today.   Urinalysis indicates UTI with small leukocytes and rare bacteria.  Patient has felt that she has had a UTI for the past few days.  She is having some frequency and dysuria.  I did consider admission secondary to patient's comorbidities however serial troponins are normal and EKG is without significant change.  Case discussed with ED attending.  Plan will be  to treat the UTI with Keflex .  She was strongly encouraged to schedule a follow-up appointment with her cardiologist.  In regards to her urinary tract infection, she is to follow-up with her primary care provider.  If chest pain gets worse or she develops additional symptoms of concern, she is to return to the emergency department.  Patient is comfortable with plan for outpatient treatment.      FINAL CLINICAL IMPRESSION(S) / ED DIAGNOSES   Final diagnoses:  Nonspecific chest pain  Acute cystitis without hematuria     Rx / DC Orders   ED Discharge Orders          Ordered    cephALEXin  (KEFLEX ) 500 MG capsule  3 times daily        05/30/24 2208             Note:  This document was prepared using Dragon voice recognition software and may include unintentional dictation errors.   Herlinda Kirk NOVAK, FNP 05/30/24 2336    Clarine Ozell LABOR, MD 06/01/24 0034  "

## 2024-05-30 NOTE — ED Provider Triage Note (Signed)
 Emergency Medicine Provider Triage Evaluation Note  Shareka D Sutphen , a 69 y.o. female  was evaluated in triage.  Pt complains of chest pain, feels tight and then sudden sharp pain. She is also having back pain, unsure if it is radiating. Pain worse with exertion, sometimes worse with deep breaths. Pain in center of chest.  Does have cardiac history  Review of Systems  Positive: CP, SOB, sore throat Negative: Fever, cough  Physical Exam  There were no vitals taken for this visit. Gen:   Awake, no distress   Resp:  Normal effort  MSK:   Moves extremities without difficulty  Other:    Medical Decision Making  Medically screening exam initiated at 4:13 PM.  Appropriate orders placed.  Felipa D Kostelnik was informed that the remainder of the evaluation will be completed by another provider, this initial triage assessment does not replace that evaluation, and the importance of remaining in the ED until their evaluation is complete.     Cleaster Tinnie LABOR, PA-C 05/30/24 1616

## 2024-05-30 NOTE — ED Notes (Signed)
 Pt stating sugar may be low. Pt pulled to triage room for CBG and repeat troponin

## 2024-05-30 NOTE — ED Triage Notes (Signed)
 Pt. In via pov from home for CP that started last night, pain radiates to the back, has pain sometimes when taking a deep breath, pain is worse with exertion, hx of MI in 03/2023

## 2024-05-30 NOTE — Discharge Instructions (Signed)
 Please call and schedule follow-up appointment with your primary care provider in regards to the urinary tract infection.  Schedule an appointment to see your cardiologist when possible.  If your chest pain returns or you start feeling worse, return to the emergency department.
